# Patient Record
Sex: Female | Born: 1970 | Race: White | Hispanic: No | Smoking: Current some day smoker
Health system: Southern US, Community
[De-identification: ages and names within clinical notes are randomized; demographics above are authoritative.]

## PROBLEM LIST (undated history)

## (undated) HISTORY — PX: AUGMENTATION MAMMAPLASTY: SUR837

## (undated) HISTORY — PX: BREAST SURGERY: SHX581

## (undated) HISTORY — PX: FOOT SURGERY: SHX648

---

## 2003-09-14 ENCOUNTER — Other Ambulatory Visit: Admission: RE | Admit: 2003-09-14 | Discharge: 2003-09-14 | Payer: Self-pay | Admitting: Obstetrics & Gynecology

## 2004-01-04 ENCOUNTER — Inpatient Hospital Stay (HOSPITAL_COMMUNITY): Admission: AD | Admit: 2004-01-04 | Discharge: 2004-01-05 | Payer: Self-pay | Admitting: Obstetrics & Gynecology

## 2004-05-30 ENCOUNTER — Inpatient Hospital Stay (HOSPITAL_COMMUNITY): Admission: AD | Admit: 2004-05-30 | Discharge: 2004-06-01 | Payer: Self-pay | Admitting: Obstetrics & Gynecology

## 2004-06-02 ENCOUNTER — Encounter: Admission: RE | Admit: 2004-06-02 | Discharge: 2004-07-02 | Payer: Self-pay | Admitting: Obstetrics & Gynecology

## 2004-09-22 ENCOUNTER — Other Ambulatory Visit: Admission: RE | Admit: 2004-09-22 | Discharge: 2004-09-22 | Payer: Self-pay | Admitting: Obstetrics & Gynecology

## 2006-09-27 ENCOUNTER — Encounter: Admission: RE | Admit: 2006-09-27 | Discharge: 2006-09-27 | Payer: Self-pay | Admitting: Obstetrics & Gynecology

## 2010-08-04 ENCOUNTER — Emergency Department: Payer: Self-pay | Admitting: Emergency Medicine

## 2010-10-26 ENCOUNTER — Other Ambulatory Visit: Payer: Self-pay | Admitting: Obstetrics & Gynecology

## 2011-11-19 ENCOUNTER — Other Ambulatory Visit: Payer: Self-pay | Admitting: Obstetrics & Gynecology

## 2013-01-27 ENCOUNTER — Other Ambulatory Visit: Payer: Self-pay | Admitting: Obstetrics & Gynecology

## 2014-04-20 ENCOUNTER — Other Ambulatory Visit: Payer: Self-pay | Admitting: Obstetrics & Gynecology

## 2014-04-21 LAB — CYTOLOGY - PAP

## 2014-04-27 ENCOUNTER — Other Ambulatory Visit: Payer: Self-pay | Admitting: Obstetrics & Gynecology

## 2015-10-25 ENCOUNTER — Other Ambulatory Visit: Payer: Self-pay | Admitting: Obstetrics & Gynecology

## 2015-10-27 LAB — CYTOLOGY - PAP

## 2016-12-18 ENCOUNTER — Other Ambulatory Visit: Payer: Self-pay | Admitting: Obstetrics & Gynecology

## 2016-12-20 LAB — CYTOLOGY - PAP

## 2018-03-25 ENCOUNTER — Other Ambulatory Visit: Payer: Self-pay

## 2018-03-25 ENCOUNTER — Ambulatory Visit
Admission: EM | Admit: 2018-03-25 | Discharge: 2018-03-25 | Disposition: A | Discharge status: Home / Self Care | Payer: BLUE CROSS/BLUE SHIELD | Attending: Family Medicine | Admitting: Family Medicine

## 2018-03-25 ENCOUNTER — Encounter: Payer: Self-pay | Admitting: Emergency Medicine

## 2018-03-25 DIAGNOSIS — J40 Bronchitis, not specified as acute or chronic: Secondary | ICD-10-CM | POA: Diagnosis not present

## 2018-03-25 MED ORDER — DOXYCYCLINE HYCLATE 100 MG PO CAPS: 100.0000 mg | ORAL_CAPSULE | Freq: Two times a day (BID) | ORAL | 0 refills | Status: DC

## 2018-03-25 MED ORDER — HYDROCOD POLST-CPM POLST ER 10-8 MG/5ML PO SUER: 5.0000 mL | Freq: Two times a day (BID) | ORAL | 0 refills | Status: DC | PRN

## 2018-03-25 NOTE — ED Triage Notes (Signed)
Patient c/o cough and chest congestion since last Friday.   

## 2018-03-25 NOTE — ED Provider Notes (Signed)
[facility]    CSN: [id number] Arrival date & time: 03/25/18  1153  History   Chief Complaint Chief Complaint  Patient presents with  . Cough   HPI  47 year old female presents with cough.  Patient states that she is been sick since Friday.  She is had cough and congestion.  Productive cough.  No fever.  No chills.  She does hear a "rattling" in her chest.  No known exacerbating or relieving factors.  She seems to be worsening and not improving.  No other associated symptoms.  No other complaints.  Social Hx reviewed. Social History Social History   Tobacco Use  . Smoking status: Never Smoker  . Smokeless tobacco: Never Used  Substance Use Topics  . Alcohol use: Not Currently    Frequency: Never  . Drug use: Not on file   Allergies   Patient has no known allergies.   Review of Systems Review of Systems  Constitutional: Negative for fever.  HENT: Positive for congestion.   Respiratory: Positive for cough and chest tightness.     Physical Exam Triage Vital Signs ED Triage Vitals  Enc Vitals Group     BP 03/25/18 1215 118/85     Pulse Rate 03/25/18 1215 83     Resp 03/25/18 1215 16     Temp 03/25/18 1215 98.8 F (37.1 C)     Temp Source 03/25/18 1215 Oral     SpO2 03/25/18 1215 96 %     Weight 03/25/18 1211 195 lb (88.5 kg)     Height 03/25/18 1211 5\' 4"  (1.626 m)     Head Circumference --      Peak Flow --      Pain Score 03/25/18 1211 0     Pain Loc --      Pain Edu? --      Excl. in [place]? --    Updated Vital Signs BP 118/85 (BP Location: Left Arm)   Pulse 83   Temp 98.8 F (37.1 C) (Oral)   Resp 16   Ht 5\' 4"  (1.626 m)   Wt 88.5 kg   SpO2 96%   BMI 33.47 kg/m   Visual Acuity Right Eye Distance:   Left Eye Distance:   Bilateral Distance:    Right Eye Near:   Left Eye Near:    Bilateral Near:     Physical Exam  Constitutional: She is oriented to person, place, and time. She appears well-developed. No distress.  HENT:  Head:  Normocephalic and atraumatic.  Mouth/Throat: Oropharynx is clear and moist.  Cardiovascular: Normal rate and regular rhythm.  Pulmonary/Chest: Effort normal and breath sounds normal. She has no wheezes. She has no rales.  Neurological: She is alert and oriented to person, place, and time.  Psychiatric: She has a normal mood and affect. Her behavior is normal.  Nursing note and vitals reviewed.  UC Treatments / Results  Labs (all labs ordered are listed, but only abnormal results are displayed) Labs Reviewed - No data to display  EKG None  Radiology No results found.  Procedures Procedures (including critical care time)  Medications Ordered in UC Medications - No data to display  Initial Impression / Assessment and Plan / UC Course  I have reviewed the triage vital signs and the nursing notes.  Pertinent labs & imaging results that were available during my care of the patient were reviewed by me and considered in my medical decision making (see chart for details).  47 year old female presents with bronchitis.  Likely viral.  Tussionex as prescribed.  Doxycycline if she fails to improve or worsens.  Final Clinical Impressions(s) / UC Diagnoses   Final diagnoses:  Bronchitis   Discharge Instructions   None    ED Prescriptions    Medication Sig Dispense Auth. Provider   doxycycline (VIBRAMYCIN) 100 MG capsule Take 1 capsule (100 mg total) by mouth 2 (two) times daily. 14 capsule Vernon, [place] G, DO   chlorpheniramine-HYDROcodone (TUSSIONEX PENNKINETIC ER) 10-8 MG/5ML SUER Take 5 mLs by mouth every 12 (twelve) hours as needed. 60 mL Tommie Sams, DO     Controlled Substance Prescriptions [place] Controlled Substance Registry consulted? Not Applicable   Tommie Sams, DO 03/25/18 1323

## 2019-05-15 ENCOUNTER — Emergency Department: Payer: BC Managed Care – PPO

## 2019-05-15 ENCOUNTER — Other Ambulatory Visit: Payer: Self-pay

## 2019-05-15 ENCOUNTER — Emergency Department
Admission: EM | Admit: 2019-05-15 | Discharge: 2019-05-16 | Disposition: A | Discharge status: Home / Self Care | Payer: BC Managed Care – PPO | Attending: Emergency Medicine | Admitting: Emergency Medicine

## 2019-05-15 ENCOUNTER — Other Ambulatory Visit: Payer: Self-pay | Admitting: Emergency Medicine

## 2019-05-15 ENCOUNTER — Encounter: Payer: Self-pay | Admitting: Emergency Medicine

## 2019-05-15 DIAGNOSIS — R079 Chest pain, unspecified: Secondary | ICD-10-CM | POA: Diagnosis present

## 2019-05-15 DIAGNOSIS — R1011 Right upper quadrant pain: Secondary | ICD-10-CM | POA: Insufficient documentation

## 2019-05-15 DIAGNOSIS — R1013 Epigastric pain: Secondary | ICD-10-CM | POA: Insufficient documentation

## 2019-05-15 DIAGNOSIS — F17210 Nicotine dependence, cigarettes, uncomplicated: Secondary | ICD-10-CM | POA: Insufficient documentation

## 2019-05-15 LAB — TROPONIN I (HIGH SENSITIVITY): Troponin I (High Sensitivity): <2 ng/L (ref <18)

## 2019-05-15 LAB — COMPREHENSIVE METABOLIC PANEL
ALT: 95 U/L — ABNORMAL HIGH (ref 0–44)
AST: 143 U/L — ABNORMAL HIGH (ref 15–41)
Albumin: 4.1 g/dL (ref 3.5–5.0)
Alkaline Phosphatase: 101 U/L (ref 38–126)
Anion gap: 8 (ref 5–15)
BUN: 13 mg/dL (ref 6–20)
CO2: 26 mmol/L (ref 22–32)
Calcium: 9.2 mg/dL (ref 8.9–10.3)
Chloride: 104 mmol/L (ref 98–111)
Creatinine, Ser: 0.74 mg/dL (ref 0.44–1.00)
GFR calc Af Amer: >60 mL/min (ref >60)
GFR calc non Af Amer: >60 mL/min (ref >60)
Glucose, Bld: 117 mg/dL — ABNORMAL HIGH (ref 70–99)
Potassium: 3.7 mmol/L (ref 3.5–5.1)
Sodium: 138 mmol/L (ref 135–145)
Total Bilirubin: 1.2 mg/dL (ref 0.3–1.2)
Total Protein: 6.9 g/dL (ref 6.5–8.1)

## 2019-05-15 LAB — LIPASE, BLOOD: Lipase: 21 U/L (ref 11–51)

## 2019-05-15 LAB — CBC
HCT: 39.6 % (ref 36.0–46.0)
Hemoglobin: 13.6 g/dL (ref 12.0–15.0)
MCH: 32.3 pg (ref 26.0–34.0)
MCHC: 34.3 g/dL (ref 30.0–36.0)
MCV: 94.1 fL (ref 80.0–100.0)
Platelets: 261 10*3/uL (ref 150–400)
RBC: 4.21 MIL/uL (ref 3.87–5.11)
RDW: 12.4 % (ref 11.5–15.5)
WBC: 13.3 10*3/uL — ABNORMAL HIGH (ref 4.0–10.5)
nRBC: 0 % (ref 0.0–0.2)

## 2019-05-15 LAB — POCT PREGNANCY, URINE: Preg Test, Ur: NEGATIVE

## 2019-05-15 MED ORDER — MORPHINE SULFATE (PF) 4 MG/ML IV SOLN
4.0000 mg | Freq: Once | INTRAVENOUS | Status: AC
  Administered 2019-05-15: 4 mg via INTRAVENOUS
  Filled 2019-05-15: qty 1

## 2019-05-15 MED ORDER — ONDANSETRON HCL 4 MG/2ML IJ SOLN
4.0000 mg | Freq: Once | INTRAMUSCULAR | Status: AC
  Administered 2019-05-15: 4 mg via INTRAVENOUS
  Filled 2019-05-15: qty 2

## 2019-05-15 MED ORDER — SODIUM CHLORIDE 0.9 % IV BOLUS
1000.0000 mL | Freq: Once | INTRAVENOUS | Status: AC
  Administered 2019-05-15: 1000 mL via INTRAVENOUS

## 2019-05-15 NOTE — ED Provider Notes (Signed)
[facility] Emergency Department Provider Note   ____________________________________________   I have reviewed the triage vital signs and the nursing notes.   HISTORY  Chief Complaint Epigastric pain  History limited by: Not Limited   HPI Beth Tran is a 48 y.o. female who presents to the emergency department today because of concerns for epigastric pain.  She states the pain started about 2 weeks ago.  Is located in the epigastric region.  When it would come on it would last for about an hour.  It does radiate up the center part of her chest. She denies noticing any pattern to factors that brought the pain on or made the pain better.  It did seem to happen more at night.  Saw her doctor about the pain last week and was being treated for gastritis.  She has not noticed any significant improvement since restarting on the medications.  Today at around 1:00 the pain began again.  The pain was severe.  The pain continues to the time my exam. She denies any similar pain in the past.   Records reviewed. Per medical record review patient has a history of visit to PCP roughly 1 week ago and put on treatment for gastritis.   History reviewed. No pertinent past medical history.  There are no active problems to display for this patient.   Past Surgical History:  Procedure Laterality Date  . BREAST SURGERY    . FOOT SURGERY Bilateral     Prior to Admission medications   Medication Sig Start Date End Date Taking? Authorizing Provider  chlorpheniramine-HYDROcodone (TUSSIONEX PENNKINETIC ER) 10-8 MG/5ML SUER Take 5 mLs by mouth every 12 (twelve) hours as needed. 03/25/18   Coral Spikes, DO  doxycycline (VIBRAMYCIN) 100 MG capsule Take 1 capsule (100 mg total) by mouth 2 (two) times daily. 03/25/18   Coral Spikes, DO    Allergies Patient has no known allergies.  No family history on file.  Social History Social History   Tobacco Use  . Smoking status: Current  Every Day Smoker  . Smokeless tobacco: Never Used  Substance Use Topics  . Alcohol use: Yes    Frequency: Never    Comment: occ  . Drug use: Never    Review of Systems Constitutional: No fever/chills Eyes: No visual changes. ENT: No sore throat. Cardiovascular: Denies chest pain. Respiratory: Denies shortness of breath. Gastrointestinal: Positive for epigastric pain.  Genitourinary: Negative for dysuria. Musculoskeletal: Negative for back pain. Skin: Negative for rash. Neurological: Negative for headaches, focal weakness or numbness.  ____________________________________________   PHYSICAL EXAM:  VITAL SIGNS: ED Triage Vitals  Enc Vitals Group     BP 05/15/19 1954 140/87     Pulse Rate 05/15/19 1954 (!) 56     Resp 05/15/19 1954 18     Temp 05/15/19 1954 97.7 F (36.5 C)     Temp Source 05/15/19 1954 Oral     SpO2 05/15/19 1954 99 %     Weight 05/15/19 1951 190 lb (86.2 kg)     Height 05/15/19 1951 5\' 3"  (1.6 m)     Head Circumference --      Peak Flow --      Pain Score 05/15/19 1951 8   Constitutional: Alert and oriented.  Eyes: Conjunctivae are normal.  ENT      Head: Normocephalic and atraumatic.      Nose: No congestion/rhinnorhea.      Mouth/Throat: Mucous membranes are moist.  Neck: No stridor. Hematological/Lymphatic/Immunilogical: No cervical lymphadenopathy. Cardiovascular: Normal rate, regular rhythm.  No murmurs, rubs, or gallops.  Respiratory: Normal respiratory effort without tachypnea nor retractions. Breath sounds are clear and equal bilaterally. No wheezes/rales/rhonchi. Gastrointestinal: Soft and tender to palpation in the epigastric and right upper quadrant.  Genitourinary: Deferred Musculoskeletal: Normal range of motion in all extremities. No lower extremity edema. Neurologic:  Normal speech and language. No gross focal neurologic deficits are appreciated.  Skin:  Skin is warm, dry and intact. No rash noted. Psychiatric: Mood and  affect are normal. Speech and behavior are normal. Patient exhibits appropriate insight and judgment.  ____________________________________________    LABS (pertinent positives/negatives)  Lipase 21 Trop <2 CBC wbc 13.3, hgb 13.6, plt 261 CMP wnl except glu 117, ast 143, alt 95  ____________________________________________   EKG  I, Phineas Semen, attending physician, personally viewed and interpreted this EKG  EKG Time: 1956 Rate: 61 Rhythm: normal sinus rhythm Axis: normal Intervals: qtc 432 QRS: low voltage ST changes: no st elevation Impression: abnormal ekg  ____________________________________________    RADIOLOGY  CXR No acute abnormality  RUQ Cholelithiasis. Dilated common bile duct.   ____________________________________________   PROCEDURES  Procedures  ____________________________________________   INITIAL IMPRESSION / ASSESSMENT AND PLAN / ED COURSE  Pertinent labs & imaging results that were available during my care of the patient were reviewed by me and considered in my medical decision making (see chart for details).   Patient presented to the emergency department today because of concerns for intermittent epigastric pain over the past 2 weeks.  Today around 1 pm the pain became the most severe it is been.  On exam patient is tender in the epigastric and right upper quadrant.  Patient's AST and ALT were minimally elevated.  Patient had a mild leukocytosis.  Right upper quadrant ultrasound was performed which showed gallstones without signs of cholecystitis.  There was however some dilation of the common bile duct.  Given concerns for possible obstructive process and given patient's tenderness will send for MRCP. Discussed plan with patient.   ____________________________________________   FINAL CLINICAL IMPRESSION(S) / ED DIAGNOSES  Final diagnoses:  Epigastric pain     Note: This dictation was prepared with Dragon dictation. Any  transcriptional errors that result from this process are unintentional     Phineas Semen, MD 05/15/19 2247

## 2019-05-15 NOTE — ED Notes (Signed)
Patient transported to X-ray 

## 2019-05-15 NOTE — ED Triage Notes (Signed)
Patient with complaint of chest pain that started about 01:00 this afternoon. Patient points to epigastric. Patient states that she has had nausea, vomiting and shortness of breath. Patient states that she has had similar pain in the past and diagnosed with gastritis.

## 2019-05-16 ENCOUNTER — Emergency Department: Payer: BC Managed Care – PPO

## 2019-05-16 ENCOUNTER — Other Ambulatory Visit: Payer: BC Managed Care – PPO

## 2019-05-16 LAB — TROPONIN I (HIGH SENSITIVITY): Troponin I (High Sensitivity): 2 ng/L (ref <18)

## 2019-05-16 MED ORDER — GADOBUTROL 1 MMOL/ML IV SOLN
7.5000 mL | Freq: Once | INTRAVENOUS | Status: AC | PRN
  Administered 2019-05-16: 7.5 mL via INTRAVENOUS
  Filled 2019-05-16: qty 7.5

## 2019-05-16 MED ORDER — FENTANYL CITRATE (PF) 100 MCG/2ML IJ SOLN: 50.0000 ug | Freq: Once | INTRAMUSCULAR | Status: DC

## 2019-05-16 MED ORDER — MORPHINE SULFATE (PF) 4 MG/ML IV SOLN
4.0000 mg | Freq: Once | INTRAVENOUS | Status: AC
  Administered 2019-05-16: 4 mg via INTRAVENOUS
  Filled 2019-05-16: qty 1

## 2019-05-16 MED ORDER — OXYCODONE HCL 5 MG PO TABS: 5.0000 mg | ORAL_TABLET | Freq: Three times a day (TID) | ORAL | 0 refills | Status: AC | PRN

## 2019-05-16 NOTE — ED Notes (Signed)
[facility] radiology called and MRI is now being read

## 2019-05-16 NOTE — Discharge Instructions (Addendum)
Your liver function was slightly elevated but the MRCP did not show any stones within the CBD.  You had mildly dilated biliary tree.  We are getting to give you the number to follow-up with GI as you probably need an endoscopy to evaluate for ulcer. Do not drive or work while on opioid.  Use rarely for break through pain.

## 2019-05-16 NOTE — ED Provider Notes (Signed)
2:10 AM Assumed care for off going team.   Blood pressure 140/87, pulse (!) 56, temperature 97.7 F (36.5 C), temperature source Oral, resp. rate 18, height 5\' 3"  (1.6 m), weight 86.2 kg, SpO2 99 %.  See their HPI for full report but in brief patient came in for epigastric pain.  She had minimal elevation of her AST and ALT which prompted a ultrasound that showed possible common bile duct dilation.  MRCP was done that showed no stones within the CBD and dilated dilated biliary tree.  Discussed with patient and she is already started on a PPI and carafate a few days ago but still having a severe pain we discussed avoiding NSAIDs, alcohol, smoking and following up with GI for endoscopy.  In the meantime we will give a very short course of oxycodone to help with breakthrough pain given patient's symptoms.  At this time patient looks very comfortable has had no vomiting and feels comfortable with being going home and following up outpatient.  Patient also given surgeries number to follow-up for gallbladder removal.  I discussed the provisional nature of ED diagnosis, the treatment so far, the ongoing plan of care, follow up appointments and return precautions with the patient and any family or support people present. They expressed understanding and agreed with the plan, discharged home.           Vanessa Jerusalem, MD 05/16/19 [phone]

## 2019-05-26 ENCOUNTER — Encounter: Payer: Self-pay | Admitting: *Deleted

## 2019-05-26 ENCOUNTER — Emergency Department
Admission: EM | Admit: 2019-05-26 | Discharge: 2019-05-26 | Disposition: A | Discharge status: Home / Self Care | Payer: BC Managed Care – PPO | Attending: Emergency Medicine | Admitting: Emergency Medicine

## 2019-05-26 ENCOUNTER — Other Ambulatory Visit: Payer: Self-pay

## 2019-05-26 ENCOUNTER — Emergency Department: Payer: BC Managed Care – PPO

## 2019-05-26 DIAGNOSIS — K8020 Calculus of gallbladder without cholecystitis without obstruction: Secondary | ICD-10-CM | POA: Diagnosis not present

## 2019-05-26 DIAGNOSIS — F17210 Nicotine dependence, cigarettes, uncomplicated: Secondary | ICD-10-CM | POA: Diagnosis not present

## 2019-05-26 DIAGNOSIS — R1013 Epigastric pain: Secondary | ICD-10-CM | POA: Diagnosis present

## 2019-05-26 DIAGNOSIS — Z3201 Encounter for pregnancy test, result positive: Secondary | ICD-10-CM

## 2019-05-26 DIAGNOSIS — R10819 Abdominal tenderness, unspecified site: Secondary | ICD-10-CM | POA: Diagnosis not present

## 2019-05-26 DIAGNOSIS — R109 Unspecified abdominal pain: Secondary | ICD-10-CM

## 2019-05-26 LAB — URINALYSIS, COMPLETE (UACMP) WITH MICROSCOPIC
Bacteria, UA: NONE SEEN
Bilirubin Urine: NEGATIVE
Glucose, UA: NEGATIVE mg/dL
Hgb urine dipstick: NEGATIVE
Ketones, ur: NEGATIVE mg/dL
Leukocytes,Ua: NEGATIVE
Nitrite: NEGATIVE
Protein, ur: NEGATIVE mg/dL
Specific Gravity, Urine: 1.014 (ref 1.005–1.030)
pH: 6 (ref 5.0–8.0)

## 2019-05-26 LAB — CBC
HCT: 44.3 % (ref 36.0–46.0)
Hemoglobin: 14.8 g/dL (ref 12.0–15.0)
MCH: 32 pg (ref 26.0–34.0)
MCHC: 33.4 g/dL (ref 30.0–36.0)
MCV: 95.7 fL (ref 80.0–100.0)
Platelets: 322 10*3/uL (ref 150–400)
RBC: 4.63 MIL/uL (ref 3.87–5.11)
RDW: 12.5 % (ref 11.5–15.5)
WBC: 13.2 10*3/uL — ABNORMAL HIGH (ref 4.0–10.5)
nRBC: 0 % (ref 0.0–0.2)

## 2019-05-26 LAB — COMPREHENSIVE METABOLIC PANEL
ALT: 119 U/L — ABNORMAL HIGH (ref 0–44)
AST: 169 U/L — ABNORMAL HIGH (ref 15–41)
Albumin: 4.3 g/dL (ref 3.5–5.0)
Alkaline Phosphatase: 132 U/L — ABNORMAL HIGH (ref 38–126)
Anion gap: 11 (ref 5–15)
BUN: 10 mg/dL (ref 6–20)
CO2: 25 mmol/L (ref 22–32)
Calcium: 9.4 mg/dL (ref 8.9–10.3)
Chloride: 104 mmol/L (ref 98–111)
Creatinine, Ser: 0.69 mg/dL (ref 0.44–1.00)
GFR calc Af Amer: >60 mL/min (ref >60)
GFR calc non Af Amer: >60 mL/min (ref >60)
Glucose, Bld: 114 mg/dL — ABNORMAL HIGH (ref 70–99)
Potassium: 3.8 mmol/L (ref 3.5–5.1)
Sodium: 140 mmol/L (ref 135–145)
Total Bilirubin: 1.1 mg/dL (ref 0.3–1.2)
Total Protein: 7.4 g/dL (ref 6.5–8.1)

## 2019-05-26 LAB — LIPASE, BLOOD: Lipase: 54 U/L — ABNORMAL HIGH (ref 11–51)

## 2019-05-26 LAB — HCG, QUANTITATIVE, PREGNANCY: hCG, Beta Chain, Quant, S: 9 m[IU]/mL — ABNORMAL HIGH (ref <5)

## 2019-05-26 MED ORDER — HYDROCODONE-ACETAMINOPHEN 5-325 MG PO TABS: 1.0000 | ORAL_TABLET | ORAL | 0 refills | Status: DC | PRN

## 2019-05-26 MED ORDER — LIDOCAINE VISCOUS HCL 2 % MT SOLN
15.0000 mL | Freq: Once | OROMUCOSAL | Status: AC
  Administered 2019-05-26: 15 mL via ORAL
  Filled 2019-05-26: qty 15

## 2019-05-26 MED ORDER — ALUM & MAG HYDROXIDE-SIMETH 200-200-20 MG/5ML PO SUSP
30.0000 mL | Freq: Once | ORAL | Status: AC
  Administered 2019-05-26: 18:00:00 30 mL via ORAL
  Filled 2019-05-26: qty 30

## 2019-05-26 MED ORDER — ONDANSETRON 4 MG PO TBDP: 4.0000 mg | ORAL_TABLET | Freq: Three times a day (TID) | ORAL | 0 refills | Status: AC | PRN

## 2019-05-26 MED ORDER — ONDANSETRON 4 MG PO TBDP: 4.0000 mg | ORAL_TABLET | Freq: Three times a day (TID) | ORAL | 0 refills | Status: DC | PRN

## 2019-05-26 NOTE — Discharge Instructions (Signed)
Please follow-up with your GI doctor as scheduled.  Please follow-up with your OB/GYN as scheduled please inform them that your beta hCG level was 9 in the emergency department today, and that may require further work-up or monitoring going forward.  Return to the emergency department for any sudden increase in pain, fever, or any other symptom personally concerning to yourself.

## 2019-05-26 NOTE — ED Notes (Signed)
POC urine preg appears faintly positive. RN checked a second time and it appears faintly positive another time. Hcg blood test ordered to confirm.

## 2019-05-26 NOTE — ED Provider Notes (Signed)
[facility] Emergency Department Provider Note  Time seen: 6:29 PM  I have reviewed the triage vital signs and the nursing notes.   HISTORY  Chief Complaint Abdominal Pain   HPI Beth Tran is a 48 y.o. female with no significant past medical history presents to the emergency department for epigastric pain.  According to the patient she was diagnosed with gallstones, has had a work-up including a GI visit.  Had an MRI showing gallstones but no inflammation of the gallbladder.  Patient states the pain has been intermittent over the past 1 month.  She has been taking her PPI and Carafate as prescribed by her GI doctor and has an endoscopy scheduled in approximately 2 weeks.  Patient states her pain worsened today which she describes more of a burning or sharp pain in the upper abdomen.  She came to the emergency department for evaluation.  Denies any fever.    History reviewed. No pertinent past medical history.  There are no active problems to display for this patient.   Past Surgical History:  Procedure Laterality Date  . BREAST SURGERY    . FOOT SURGERY Bilateral     Prior to Admission medications   Medication Sig Start Date End Date Taking? Authorizing Provider  chlorpheniramine-HYDROcodone (TUSSIONEX PENNKINETIC ER) 10-8 MG/5ML SUER Take 5 mLs by mouth every 12 (twelve) hours as needed. 03/25/18   Coral Spikes, DO  doxycycline (VIBRAMYCIN) 100 MG capsule Take 1 capsule (100 mg total) by mouth 2 (two) times daily. 03/25/18   Coral Spikes, DO    No Known Allergies  History reviewed. No pertinent family history.  Social History Social History   Tobacco Use  . Smoking status: Current Every Day Smoker  . Smokeless tobacco: Never Used  Substance Use Topics  . Alcohol use: Yes    Frequency: Never    Comment: occ  . Drug use: Never    Review of Systems Constitutional: Negative for fever. Cardiovascular: Negative for chest pain. Respiratory:  Negative for shortness of breath. Gastrointestinal: Upper abdominal/epigastric pain. Genitourinary: Negative for urinary compaints Musculoskeletal: Negative for musculoskeletal complaints Neurological: Negative for headache All other ROS negative  ____________________________________________   PHYSICAL EXAM:  VITAL SIGNS: ED Triage Vitals  Enc Vitals Group     BP 05/26/19 1556 (!) 131/98     Pulse Rate 05/26/19 1556 62     Resp 05/26/19 1556 18     Temp 05/26/19 1556 98.7 F (37.1 C)     Temp Source 05/26/19 1556 Oral     SpO2 05/26/19 1556 99 %     Weight 05/26/19 1557 183 lb (83 kg)     Height 05/26/19 1557 5\' 3"  (1.6 m)     Head Circumference --      Peak Flow --      Pain Score --      Pain Loc --      Pain Edu? --      Excl. in [place]? --     Constitutional: Alert and oriented. Well appearing and in no distress. Eyes: Normal exam ENT      Head: Normocephalic and atraumatic.      Mouth/Throat: Mucous membranes are moist. Cardiovascular: Normal rate, regular rhythm. No murmur Respiratory: Normal respiratory effort without tachypnea nor retractions. Breath sounds are clear  Gastrointestinal: Soft, moderate epigastric tenderness to palpation without rebound guarding or distention. Musculoskeletal: Nontender with normal range of motion in all extremities Neurologic:  Normal speech and language.  No gross focal neurologic deficits Skin:  Skin is warm, dry and intact.  Psychiatric: Mood and affect are normal.  ____________________________________________    EKG  EKG shows sinus bradycardia 59 bpm with a narrow QRS, normal axis, normal intervals with no concerning ST changes.  ____________________________________________     RADIOLOGY  IMPRESSION:  1. Cholelithiasis without sonographic evidence of acute  cholecystitis.  2. Common bile duct measures 7 mm, which is the upper limits of  normal. This is unchanged compared to prior study.   IMPRESSION:  1. No  intrauterine gestational sac identified. IUD appears  appropriately positioned within the endometrial cavity. Correlation  with serial beta HCG levels and short-term follow-up ultrasound are  recommended.  2. Unremarkable appearance of the right ovary/adnexa.  3. Left ovary was not visualized.  4. No free fluid within the pelvis.   ____________________________________________   INITIAL IMPRESSION / ASSESSMENT AND PLAN / ED COURSE  Pertinent labs & imaging results that were available during my care of the patient were reviewed by me and considered in my medical decision making (see chart for details).   Patient presents to the emergency department for evaluation of upper abdominal pain chest epigastric pain.  Pain has been ongoing but intermittent over the past 1 month.  Has been seen in the ED as well as by GI.  Patient currently taking a PPI and Carafate for possible gastritis has an endoscopy scheduled in 2 weeks.  States her pain worsened today so she came to the emergency department for evaluation.  Differential would include biliary colic, cholecystitis, pancreatitis, gastritis or gastroenteritis.  Patient's labs have resulted showing continued mild LFT elevation, with a mild elevation of her lipase.  Patient also has very slight elevation in beta hCG which was tested after a very faint positive on a urine pregnancy test.  Patient states she is abstinent and there is no chance of pregnancy.  Given this we will proceed with an ultrasound of the pelvis to rule out ovarian pathology while the patient is in the ED.  Patient agreeable plan of care.  We will also obtain right upper quadrant ultrasound to evaluate for cholecystitis.  Patient's ultrasound shows cholelithiasis without cholecystitis.  Pelvic ultrasound shows no acute abnormalities.  We will have the patient follow-up with her doctor regarding her slightly elevated beta hCG.  Patient has a follow-up with GI medicine already  scheduled.  Beth Tran was evaluated in Emergency Department on 05/26/2019 for the symptoms described in the history of present illness. She was evaluated in the context of the global COVID-19 pandemic, which necessitated consideration that the patient might be at risk for infection with the SARS-CoV-2 virus that causes COVID-19. Institutional protocols and algorithms that pertain to the evaluation of patients at risk for COVID-19 are in a state of rapid change based on information released by regulatory bodies including the CDC and federal and state organizations. These policies and algorithms were followed during the patient's care in the ED.  ____________________________________________   FINAL CLINICAL IMPRESSION(S) / ED DIAGNOSES  Upper abdominal pain   Minna Antis, MD 05/26/19 1956

## 2019-05-26 NOTE — ED Triage Notes (Signed)
Pt to ED reporting abd pain with NVD. No fever at this time. Pt reports she was seen in ED 1 week ago and was told she had gallstones but no other reason for the pain.

## 2019-06-09 ENCOUNTER — Other Ambulatory Visit: Payer: Self-pay | Admitting: Gastroenterology

## 2019-06-09 DIAGNOSIS — R1013 Epigastric pain: Secondary | ICD-10-CM

## 2019-06-09 DIAGNOSIS — K8020 Calculus of gallbladder without cholecystitis without obstruction: Secondary | ICD-10-CM

## 2019-06-17 ENCOUNTER — Other Ambulatory Visit: Payer: Self-pay

## 2019-06-17 ENCOUNTER — Encounter
Admission: RE | Admit: 2019-06-17 | Discharge: 2019-06-17 | Disposition: A | Discharge status: Home / Self Care | Payer: BC Managed Care – PPO | Source: Ambulatory Visit | Attending: Gastroenterology | Admitting: Gastroenterology

## 2019-06-17 DIAGNOSIS — R1013 Epigastric pain: Secondary | ICD-10-CM | POA: Diagnosis present

## 2019-06-17 DIAGNOSIS — K8020 Calculus of gallbladder without cholecystitis without obstruction: Secondary | ICD-10-CM

## 2019-06-17 MED ORDER — TECHNETIUM TC 99M MEBROFENIN IV KIT
5.0000 | PACK | Freq: Once | INTRAVENOUS | Status: AC | PRN
  Administered 2019-06-17: 5.219 via INTRAVENOUS

## 2021-02-17 IMAGING — MR MR ABDOMEN WO/W CM MRCP
18 of 23 series · 35 of 48 positions shown · IV contrast (7.5ml Gadavist)
Comparison: Right upper quadrant ultrasound dated 05/15/2019

CLINICAL DATA: Epigastric pain, dilated CBD

EXAM:
MRI ABDOMEN WITHOUT AND WITH CONTRAST (INCLUDING MRCP)
TECHNIQUE: Multiplanar multisequence MR imaging of the abdomen was performed
both before and after the administration of intravenous contrast.
Heavily T2-weighted images of the biliary and pancreatic ducts were
obtained, and three-dimensional MRCP images were rendered by post
processing.
CONTRAST:  7.5mL GADAVIST GADOBUTROL 1 MMOL/ML IV SOLN

[Series 3: bSSFP · coronal · 6.0mm · 0.78mm/px · 2 of 30 slices shown]
[im 1/30]
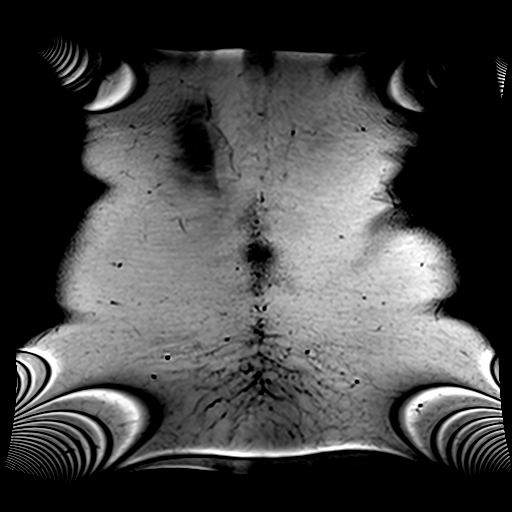
[im 30/30]
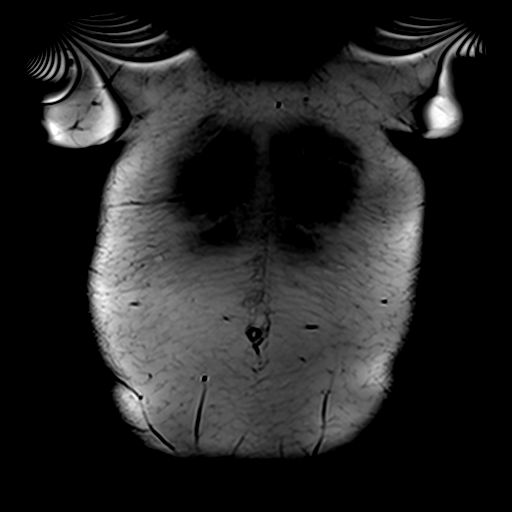

[Series 4: T2 · axial · 6.0mm · 1.25mm/px · z∈[-20,+203]mm · 2 of 32 slices shown]
[im 1/32]
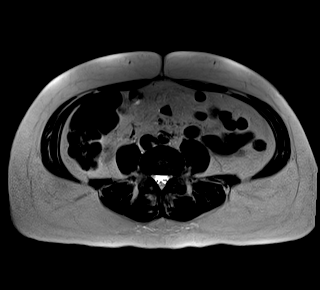
[im 32/32]
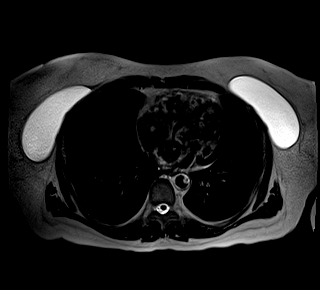

[Series 5: T1 · axial · 6.0mm · 0.74mm/px · z∈[-20,+203]mm · 2 of 32 slices shown (1 of 2)]
[im 1/32]
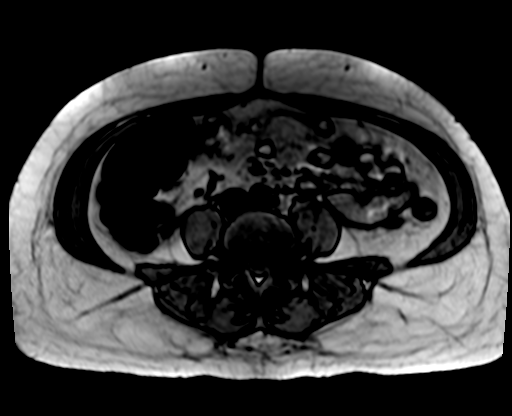
[im 32/32]
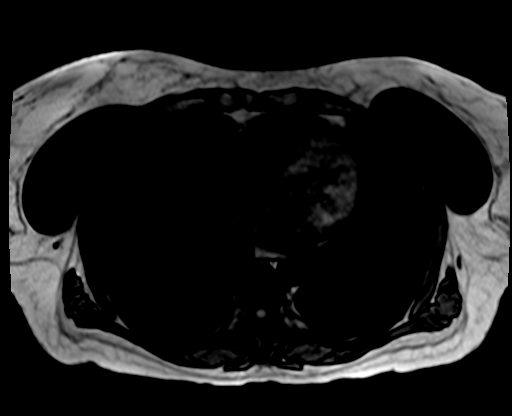

[Series 5: T1 · axial · 6.0mm · 0.74mm/px · 1 of 32 slices shown (2 of 2)]
[im 1/32]
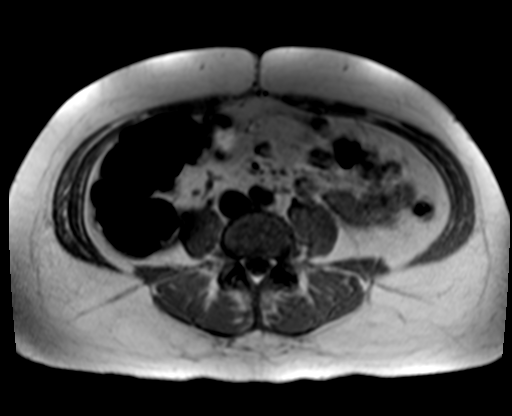

[Series 8: T2 fat-sat · axial · 6.0mm · 1.25mm/px · 1 of 30 slices shown]
[im 1/30]
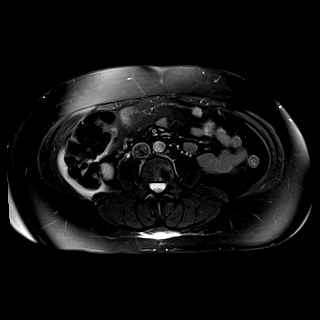

[Series 9: ax dwi_tracew · axial · 6.0mm · 1.42mm/px · 1 of 30 slices shown (1 of 3)]
[im 1/30]
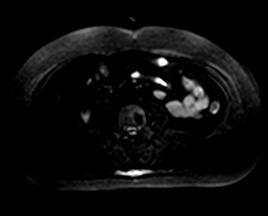

[Series 9: ax dwi_tracew · axial · 6.0mm · 1.42mm/px · 1 of 30 slices shown (2 of 3)]
[im 1/30]
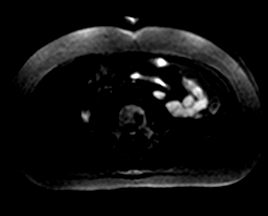

[Series 9: ax dwi_tracew · axial · 6.0mm · 1.42mm/px · 1 of 30 slices shown (3 of 3)]
[im 1/30]
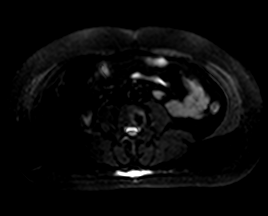

[Series 10: ax dwi_adc · axial · 6.0mm · 1.42mm/px · 1 of 30 slices shown]
[im 1/30]
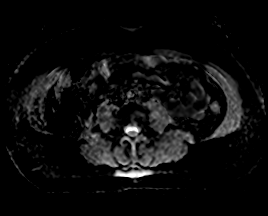

[Series 12: t2_space_cor_cs20_trig_384_iso · coronal · 1.0mm · 0.52mm/px · 3 of 72 slices shown]
[im 1/72]
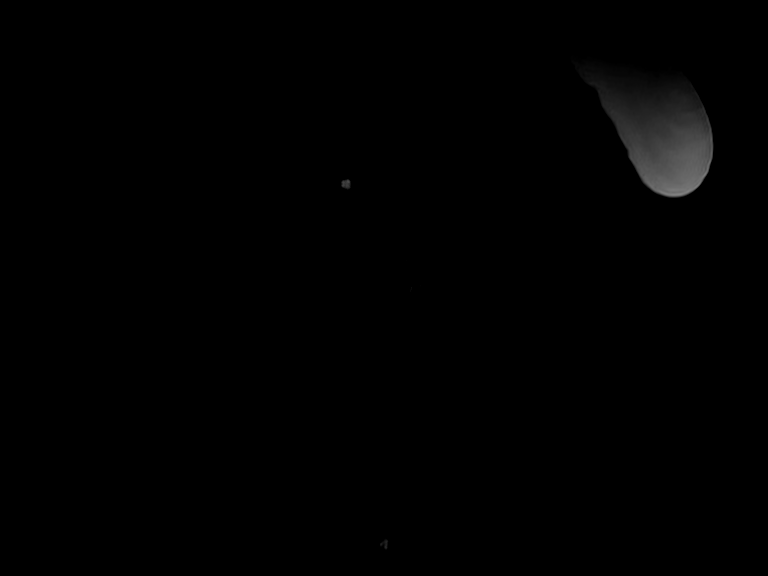
[im 36/72]
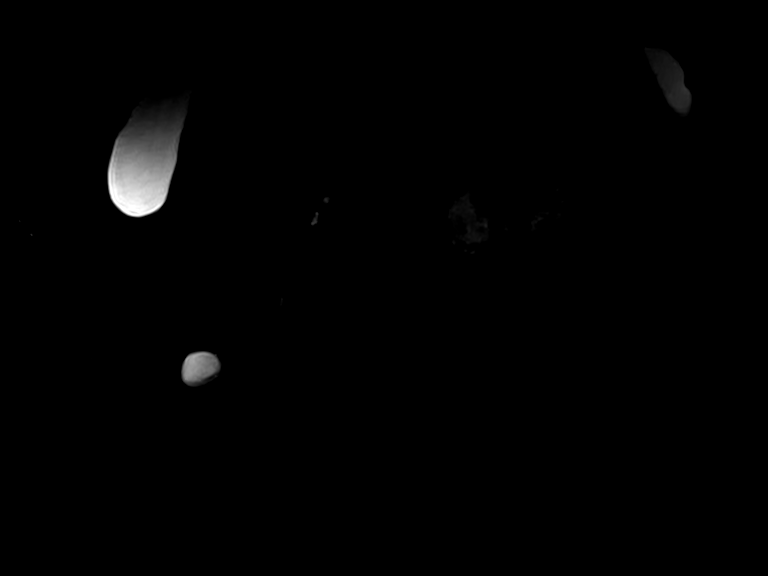
[im 72/72]
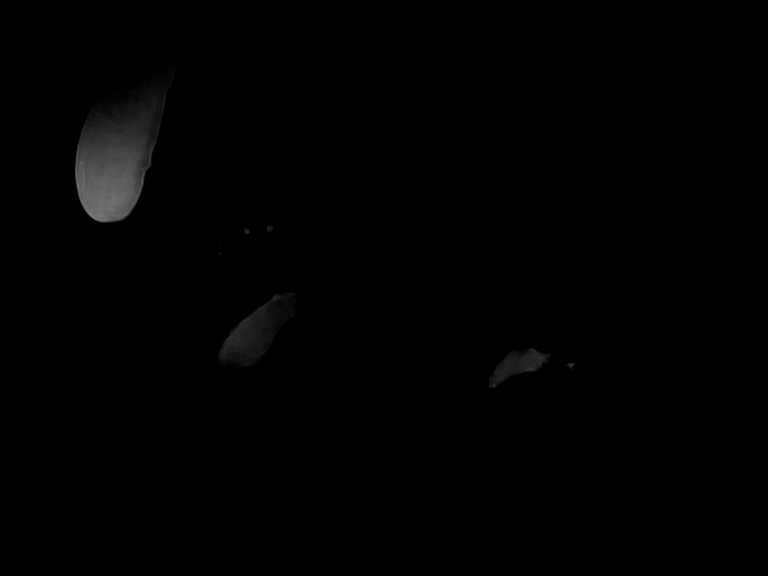

[Series 14: MRCP · coronal · 3.0mm · 1.25mm/px · 1 of 17 slices shown]
[im 1/17]
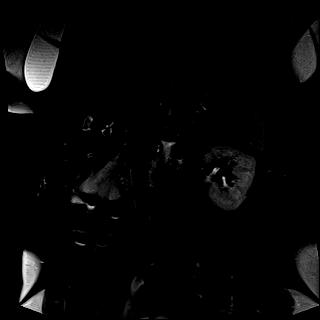

[Series 15: radials · coronal · 50.0mm · 0.78mm/px · 1 of 5 slices shown]
[im 1/5]
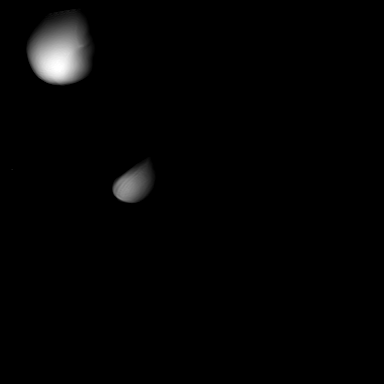

[Series 16: T1 dynamic fat-sat · axial · non-contrast · 3.0mm · 1.25mm/px · z∈[-11,+202]mm · 3 of 72 slices shown (1 of 3)]
[im 1/72]
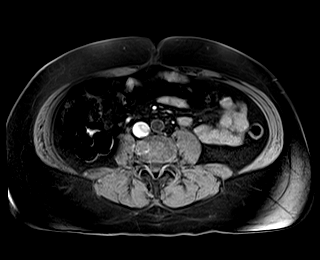
[im 36/72]
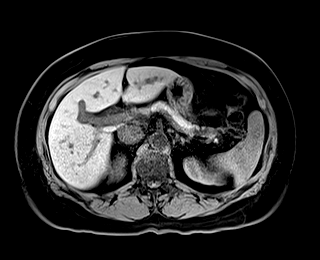
[im 72/72]
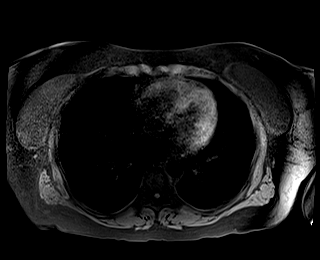

[Series 17: T1 dynamic fat-sat post-contrast · axial · 3.0mm · 1.25mm/px · z∈[-11,+202]mm · 3 of 72 slices shown (1 of 3)]
[im 1/72]
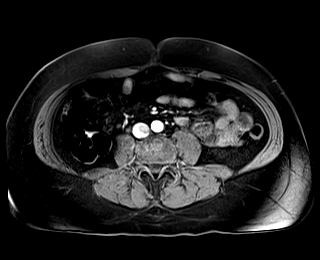
[im 36/72]
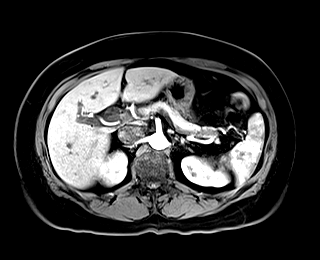
[im 72/72]
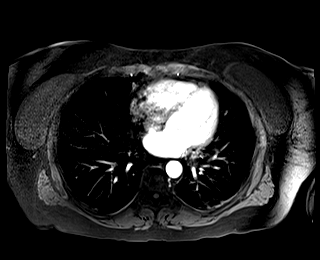

[Series 18: T1 dynamic fat-sat · axial · 3.0mm · 1.25mm/px · z∈[-11,+202]mm · 3 of 72 slices shown (2 of 3)]
[im 1/72]
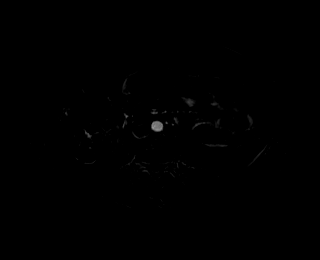
[im 36/72]
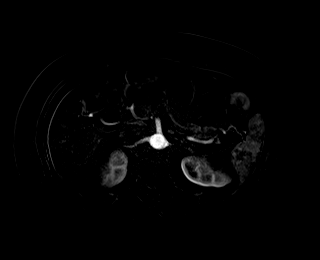
[im 72/72]
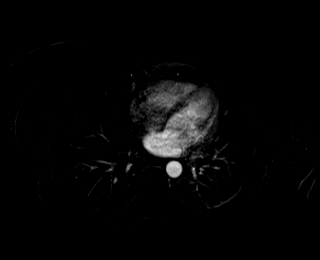

[Series 19: T1 dynamic fat-sat post-contrast · axial · 3.0mm · 1.25mm/px · z∈[-11,+202]mm · 3 of 72 slices shown (2 of 3)]
[im 1/72]
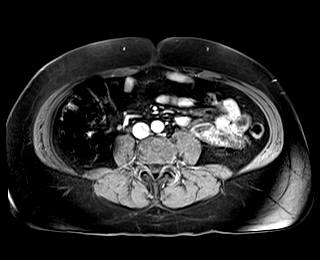
[im 36/72]
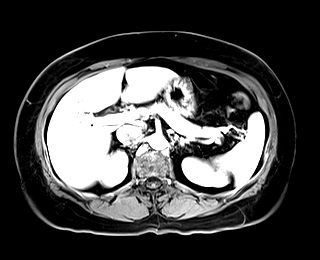
[im 72/72]
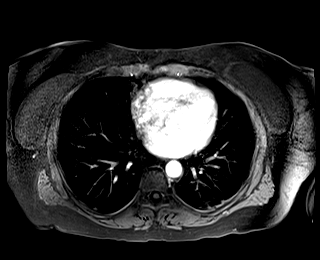

[Series 20: T1 dynamic fat-sat · axial · 3.0mm · 1.25mm/px · z∈[-11,+202]mm · 3 of 72 slices shown (3 of 3)]
[im 1/72]
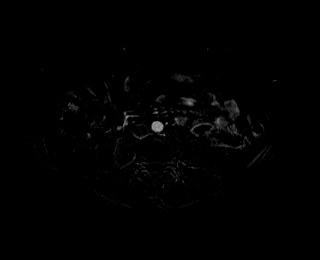
[im 36/72]
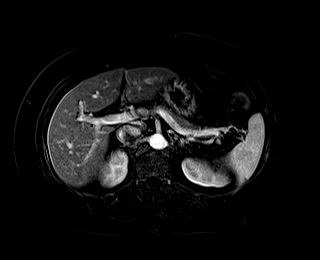
[im 72/72]
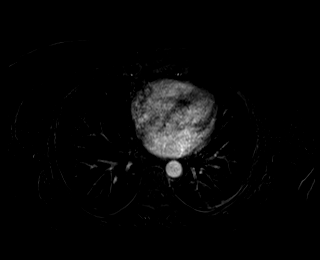

[Series 21: T1 dynamic fat-sat post-contrast · axial · 3.0mm · 1.25mm/px · z∈[-11,+202]mm · 3 of 72 slices shown (3 of 3)]
[im 1/72]
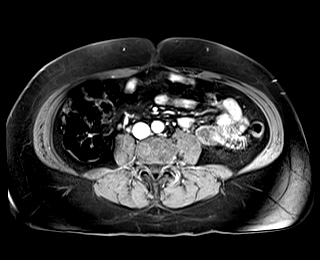
[im 36/72]
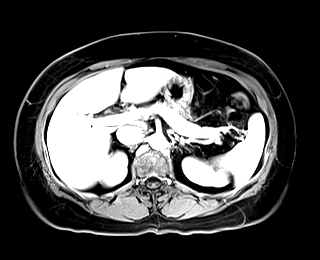
[im 72/72]
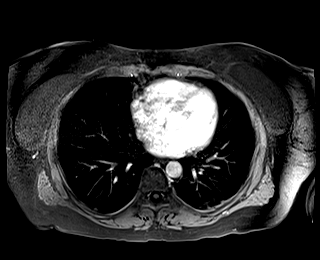

[35 of 48 positions shown; findings below may reference images not displayed]

FINDINGS: Lower chest: Lung bases are clear.

Hepatobiliary: 6 mm cyst in the left hepatic lobe (series 8/image
8). No suspicious/enhancing hepatic lesions.

Tiny layering gallstone (series 4/image 20). Mild gallbladder wall
thickening/edema. However, there are no convincing inflammatory
changes to suggest acute cholecystitis on MR.

No intrahepatic ductal dilatation. Common duct is mildly prominent,
measuring 7 mm, within the upper limits of normal. No
choledocholithiasis is seen.

Pancreas:  Within normal limits.

Spleen:  Within normal limits.

Adrenals/Urinary Tract:  Adrenal glands are within normal limits.

Small left renal cysts, measuring up to 11 mm in the lateral left
lower pole (series 4/image 28), benign (Bosniak I). Right kidney is
within normal limits. No hydronephrosis.

Stomach/Bowel: Stomach is within normal limits.

Visualized bowel is grossly unremarkable.

Vascular/Lymphatic:  No evidence of abdominal aortic aneurysm.

No suspicious abdominal lymphadenopathy.

Other:  No abdominal ascites.

Musculoskeletal: No focal osseous lesions.
IMPRESSION: Cholelithiasis. Mild gallbladder wall thickening/edema, without
convincing inflammatory changes to suggest acute cholecystitis on
MR. This appearance may be secondary to hepatic inflammation.

No intrahepatic or extrahepatic ductal dilatation. Common duct
measures 7 mm, at the upper limits of normal. No choledocholithiasis
is seen.

Additional ancillary findings as above.

## 2021-03-27 ENCOUNTER — Other Ambulatory Visit: Payer: Self-pay | Admitting: Obstetrics & Gynecology

## 2021-03-27 DIAGNOSIS — R928 Other abnormal and inconclusive findings on diagnostic imaging of breast: Secondary | ICD-10-CM

## 2021-04-07 ENCOUNTER — Ambulatory Visit
Admission: RE | Admit: 2021-04-07 | Discharge: 2021-04-07 | Disposition: A | Discharge status: Home / Self Care | Payer: BC Managed Care – PPO | Source: Ambulatory Visit | Attending: Obstetrics & Gynecology | Admitting: Obstetrics & Gynecology

## 2021-04-07 ENCOUNTER — Other Ambulatory Visit: Payer: Self-pay | Admitting: Obstetrics & Gynecology

## 2021-04-07 ENCOUNTER — Ambulatory Visit: Payer: BC Managed Care – PPO

## 2021-04-07 ENCOUNTER — Other Ambulatory Visit: Payer: Self-pay

## 2021-04-07 DIAGNOSIS — R928 Other abnormal and inconclusive findings on diagnostic imaging of breast: Secondary | ICD-10-CM

## 2021-04-15 ENCOUNTER — Emergency Department: Payer: BC Managed Care – PPO

## 2021-04-15 ENCOUNTER — Encounter: Payer: Self-pay | Admitting: Radiology

## 2021-04-15 ENCOUNTER — Emergency Department
Admission: EM | Admit: 2021-04-15 | Discharge: 2021-04-15 | Disposition: A | Discharge status: Home / Self Care | Payer: BC Managed Care – PPO | Attending: Emergency Medicine | Admitting: Emergency Medicine

## 2021-04-15 DIAGNOSIS — W1839XA Other fall on same level, initial encounter: Secondary | ICD-10-CM | POA: Insufficient documentation

## 2021-04-15 DIAGNOSIS — S6992XA Unspecified injury of left wrist, hand and finger(s), initial encounter: Secondary | ICD-10-CM | POA: Diagnosis present

## 2021-04-15 DIAGNOSIS — F17200 Nicotine dependence, unspecified, uncomplicated: Secondary | ICD-10-CM | POA: Insufficient documentation

## 2021-04-15 DIAGNOSIS — S52502A Unspecified fracture of the lower end of left radius, initial encounter for closed fracture: Secondary | ICD-10-CM | POA: Insufficient documentation

## 2021-04-15 DIAGNOSIS — S52602A Unspecified fracture of lower end of left ulna, initial encounter for closed fracture: Secondary | ICD-10-CM | POA: Diagnosis not present

## 2021-04-15 DIAGNOSIS — Q7192 Unspecified reduction defect of left upper limb: Secondary | ICD-10-CM

## 2021-04-15 MED ORDER — OXYCODONE HCL 5 MG PO TABS: 5.0000 mg | ORAL_TABLET | Freq: Three times a day (TID) | ORAL | 0 refills | Status: DC | PRN

## 2021-04-15 MED ORDER — OXYCODONE HCL 5 MG PO TABS
5.0000 mg | ORAL_TABLET | Freq: Once | ORAL | Status: AC
  Administered 2021-04-15: 5 mg via ORAL
  Filled 2021-04-15: qty 1

## 2021-04-15 MED ORDER — ACETAMINOPHEN 500 MG PO TABS
1000.0000 mg | ORAL_TABLET | Freq: Once | ORAL | Status: AC
  Administered 2021-04-15: 1000 mg via ORAL
  Filled 2021-04-15: qty 2

## 2021-04-15 MED ORDER — LIDOCAINE HCL (PF) 1 % IJ SOLN
5.0000 mL | Freq: Once | INTRAMUSCULAR | Status: AC
  Administered 2021-04-15: 5 mL
  Filled 2021-04-15: qty 5

## 2021-04-15 MED ORDER — ACETAMINOPHEN 500 MG PO TABS: 1000.0000 mg | ORAL_TABLET | Freq: Three times a day (TID) | ORAL | 0 refills | Status: AC | PRN

## 2021-04-15 NOTE — Discharge Instructions (Signed)
Elevate, apply ice. Keep splint dry and in place until cleared by Orthopedics.Follow up with Orthopedics per recommendation on your follow up discharge instructions. Take pain medication as follows:  Tylenol 1000mg  every 8 hours and 5mg  of oxycodone every 4 hours as needed for breakthrough pain.  If your pain becomes severe, if your fingers become blue/purple or pale, or if you experience pins-and-needles sensation in your fingers and hand, remove the elastic wrap and reapply it looser.  Do not change the hard part of the splint.  If that resolves your symptoms it is okay to stay home and follow-up with orthopedics.  If you continue to have those symptoms please return to the emergency room immediately.

## 2021-04-15 NOTE — ED Notes (Signed)
Please do not discuss medication questions with this patient with visitor in room

## 2021-04-15 NOTE — ED Provider Notes (Signed)
[facility] Emergency Department Provider Note  ____________________________________________  Time seen: Approximately 4:53 AM  I have reviewed the triage vital signs and the nursing notes.   HISTORY  Chief Complaint Wrist Injury   HPI Beth Tran is a 50 y.o. female with no significant past medical history who presents for evaluation of left wrist pain.  Patient reports that she was getting up when she had a cramp in her calf.  She lost her balance and fell.  She was trying to catch her self with her left hand.  She reports that she heard a popping sound from her wrist and has an obvious deformity.  She is complaining of 8 out of 10 constant sharp throbbing pain on the left wrist.  She is right-hand dominant.  She denies shoulder pain, neck pain, back pain.  She denies head trauma or LOC.  History reviewed. No pertinent past medical history.  There are no problems to display for this patient.   Past Surgical History:  Procedure Laterality Date   BREAST SURGERY     FOOT SURGERY Bilateral     Prior to Admission medications   Medication Sig Start Date End Date Taking? Authorizing Provider  acetaminophen (TYLENOL) 500 MG tablet Take 2 tablets (1,000 mg total) by mouth every 8 (eight) hours as needed for mild pain, moderate pain, fever or headache. 04/15/21 04/15/22 Yes Akshith Moncus, [place], MD  oxyCODONE (ROXICODONE) 5 MG immediate release tablet Take 1 tablet (5 mg total) by mouth every 8 (eight) hours as needed. 04/15/21 04/15/22 Yes Timblin Cohick, [place], MD  doxycycline (VIBRAMYCIN) 100 MG capsule Take 1 capsule (100 mg total) by mouth 2 (two) times daily. 03/25/18   Tommie Sams, DO  ondansetron (ZOFRAN ODT) 4 MG disintegrating tablet Take 1 tablet (4 mg total) by mouth every 8 (eight) hours as needed for nausea or vomiting. 05/26/19   Minna Antis, MD    Allergies Patient has no known allergies.  No family history on file.  Social History Social  History   Tobacco Use   Smoking status: Every Day   Smokeless tobacco: Never  Substance Use Topics   Alcohol use: Yes    Comment: occ   Drug use: Never    Review of Systems  Constitutional: Negative for fever. Eyes: Negative for visual changes. ENT: Negative for facial injury or neck injury Cardiovascular: Negative for chest injury. Respiratory: Negative for shortness of breath. Negative for chest wall injury. Gastrointestinal: Negative for abdominal pain or injury. Genitourinary: Negative for dysuria. Musculoskeletal: Negative for back injury, + L wrist pain Skin: Negative for laceration/abrasions. Neurological: Negative for head injury.   ____________________________________________   PHYSICAL EXAM:  VITAL SIGNS: ED Triage Vitals  Enc Vitals Group     BP 04/15/21 0319 (!) 146/98     Pulse Rate 04/15/21 0319 81     Resp 04/15/21 0319 16     Temp 04/15/21 0319 99.4 F (37.4 C)     Temp Source 04/15/21 0319 Oral     SpO2 04/15/21 0319 98 %     Weight 04/15/21 0321 182 lb 15.7 oz (83 kg)     Height 04/15/21 0321 5\' 3"  (1.6 m)     Head Circumference --      Peak Flow --      Pain Score 04/15/21 0321 8     Pain Loc --      Pain Edu? --      Excl. in GC? --  Full spinal precautions maintained throughout the trauma exam. Constitutional: Alert and oriented. No acute distress. Does not appear intoxicated. HEENT Head: Normocephalic and atraumatic. Face: No facial bony tenderness. Stable midface Ears: No hemotympanum bilaterally. No Battle sign Eyes: No eye injury. PERRL. No raccoon eyes Nose: Nontender. No epistaxis. No rhinorrhea Mouth/Throat: Mucous membranes are moist. No oropharyngeal blood. No dental injury. Airway patent without stridor. Normal voice. Neck: no C-collar. No midline c-spine tenderness.  Cardiovascular: Normal rate, regular rhythm. Normal and symmetric distal pulses are present in all extremities. Pulmonary/Chest: Chest wall is stable and  nontender to palpation/compression. Normal respiratory effort. Breath sounds are normal. No crepitus.  Abdominal: Soft, nontender, non distended. Musculoskeletal: Obvious dorsal deformity of the L distal radius, skin intact, Nontender with normal full range of motion in all other extremities. No deformities. No thoracic or lumbar midline spinal tenderness. Pelvis is stable. Skin: Skin is warm, dry and intact. No abrasions or contutions. Psychiatric: Speech and behavior are appropriate. Neurological: Normal speech and language. Moves all extremities to command. No gross focal neurologic deficits are appreciated.  Glascow Coma Score: 4 - Opens eyes on own 6 - Follows simple motor commands 5 - Alert and oriented GCS: 15   ____________________________________________   LABS (all labs ordered are listed, but only abnormal results are displayed)  Labs Reviewed - No data to display ____________________________________________  EKG  none  ____________________________________________  RADIOLOGY  I have personally reviewed the images performed during this visit and I agree with the Radiologist's read.   Interpretation by Radiologist:  DG Wrist 2 Views Left  Result Date: 04/15/2021 CLINICAL DATA:  50 year old female distal radius and ulnar styloid fractures status post reduction. EXAM: LEFT WRIST - 2 VIEW COMPARISON:  0334 hours. FINDINGS: Two views at 0629 hours. Cast/splint material now in place. Slightly less dorsal angulation at the impacted and comminuted distal left radius fracture. Stable mildly comminuted and dorsally displaced ulnar styloid fracture. No new osseous abnormality identified. IMPRESSION: Stable to slightly improved distal left radius fracture with impaction and comminution. Stable ulnar styloid fracture. Electronically Signed   By: Odessa Fleming M.D.   On: 04/15/2021 07:08   DG Wrist Complete Left  Result Date: 04/15/2021 CLINICAL DATA:  Pain in wrist deformity after a  fall. EXAM: LEFT WRIST - COMPLETE 3+ VIEW COMPARISON:  None. FINDINGS: Transverse comminuted fracture of the distal left radial metaphysis with fracture lines extending to the radiocarpal and radioulnar joints. There is an associated nondisplaced ulnar styloid process fracture. Dorsal angulation of the distal radial fracture fragments. Overlying soft tissue swelling. IMPRESSION: Comminuted fractures of the distal left radial metaphysis with dorsal angulation. Nondisplaced fracture of the ulnar styloid process. Electronically Signed   By: Burman Nieves M.D.   On: 04/15/2021 03:54     ____________________________________________   PROCEDURES  Procedure(s) performed:yes .Ortho Injury Treatment  Date/Time: 04/15/2021 6:17 AM Performed by: Nita Sickle, MD Authorized by: Nita Sickle, MD   Consent:    Consent obtained:  Verbal   Consent given by:  Patient   Risks discussed:  Fracture, irreducible dislocation, recurrent dislocation, stiffness, restricted joint movement, vascular damage and nerve damage   Alternatives discussed:  Immobilization and referralInjury location: wrist Location details: left wrist Injury type: fracture-dislocation Pre-procedure neurovascular assessment: neurovascularly intact Pre-procedure distal perfusion: normal Pre-procedure neurological function: normal Pre-procedure range of motion: reduced Anesthesia: hematoma block  Anesthesia: Local anesthesia used: yes Local Anesthetic: lidocaine 1% without epinephrine Anesthetic total: 5 mL  Patient sedated: NoManipulation performed: yes Skin traction  used: yes Skeletal traction used: yes Reduction successful: yes X-ray confirmed reduction: yes Immobilization: splint and sling Splint type: sugar tong Splint Applied by: ED Tech Supplies used: cotton padding, elastic bandage and Ortho-Glass Post-procedure neurovascular assessment: post-procedure neurovascularly intact Post-procedure distal perfusion:  normal Post-procedure neurological function: normal Post-procedure range of motion: unchanged   Critical Care performed:  None ____________________________________________   INITIAL IMPRESSION / ASSESSMENT AND PLAN / ED COURSE  50 y.o. female with no significant past medical history who presents for evaluation of left wrist pain status post mechanical fall.  Patient with obvious dorsal angulation of the distal radius, skin is intact, neurovascular exam is intact.  X-ray visualized by me showing comminuted fractures of the left distal radial metaphysis with dorsal angulation and the ulnar styloid.  Attempt on reduction was done per procedure note above.  Hematoma block was done. patient will be placed on splint.  Discussed splint and wound care follow-up with orthopedics.      ____________________________________________  Please note:  Patient was evaluated in Emergency Department today for the symptoms described in the history of present illness. Patient was evaluated in the context of the global COVID-19 pandemic, which necessitated consideration that the patient might be at risk for infection with the SARS-CoV-2 virus that causes COVID-19. Institutional protocols and algorithms that pertain to the evaluation of patients at risk for COVID-19 are in a state of rapid change based on information released by regulatory bodies including the CDC and federal and state organizations. These policies and algorithms were followed during the patient's care in the ED.  Some ED evaluations and interventions may be delayed as a result of limited staffing during the pandemic.   ____________________________________________   FINAL CLINICAL IMPRESSION(S) / ED DIAGNOSES   Final diagnoses:  Closed fracture of distal ends of left radius and ulna, initial encounter      NEW MEDICATIONS STARTED DURING THIS VISIT:  ED Discharge Orders          Ordered    acetaminophen (TYLENOL) 500 MG tablet  Every 8  hours PRN        04/15/21 0602    oxyCODONE (ROXICODONE) 5 MG immediate release tablet  Every 8 hours PRN        04/15/21 0602             Note:  This document was prepared using Dragon voice recognition software and may include unintentional dictation errors.    Nita Sickle, MD 04/15/21 [phone]

## 2021-04-15 NOTE — ED Triage Notes (Signed)
Patient BIB POV for left wrist deformity after falling backwards and trying to catch self. PMS distal to break still intact. Patient states the incident occurred approximately 35 min ago. Patient rates pain as 8/10 at this moment. Patient is AxOx4.

## 2021-04-21 ENCOUNTER — Other Ambulatory Visit: Payer: Self-pay | Admitting: Specialist

## 2021-04-21 NOTE — H&P (Addendum)
PREOPERATIVE H&P  Chief Complaint: Displaced Left Wrist Fracture  HPI: Beth Tran is a 50 y.o. female who presents for preoperative history and physical with a diagnosis of displaced LeftWrist Fracture.  Closed reductions have been attempted but have not provided adequate reduction.  There is still some deformity which displeases the patient.  After thorough discussion with we have elected to proceed with open reduction internal fixation of the fracture.  Symptoms are rated as moderate to severe, and have been worsening.  This is significantly impairing activities of daily living.  She has elected for surgical management.   No past medical history on file. Past Surgical History:  Procedure Laterality Date   BREAST SURGERY     FOOT SURGERY Bilateral    Social History   Socioeconomic History   Marital status: Divorced    Spouse name: Not on file   Number of children: Not on file   Years of education: Not on file   Highest education level: Not on file  Occupational History   Not on file  Tobacco Use   Smoking status: Every Day   Smokeless tobacco: Never  Substance and Sexual Activity   Alcohol use: Yes    Comment: occ   Drug use: Never   Sexual activity: Not on file  Other Topics Concern   Not on file  Social History Narrative   Not on file   Social Determinants of Health   Financial Resource Strain: Not on file  Food Insecurity: Not on file  Transportation Needs: Not on file  Physical Activity: Not on file  Stress: Not on file  Social Connections: Not on file   No family history on file. No Known Allergies Prior to Admission medications   Medication Sig Start Date End Date Taking? Authorizing Provider  acetaminophen (TYLENOL) 500 MG tablet Take 2 tablets (1,000 mg total) by mouth every 8 (eight) hours as needed for mild pain, moderate pain, fever or headache. 04/15/21 04/15/22  Nita Sickle, MD  doxycycline (VIBRAMYCIN) 100 MG capsule Take 1 capsule (100 mg total)  by mouth 2 (two) times daily. 03/25/18   Tommie Sams, DO  ondansetron (ZOFRAN ODT) 4 MG disintegrating tablet Take 1 tablet (4 mg total) by mouth every 8 (eight) hours as needed for nausea or vomiting. 05/26/19   Minna Antis, MD  oxyCODONE (ROXICODONE) 5 MG immediate release tablet Take 1 tablet (5 mg total) by mouth every 8 (eight) hours as needed. 04/15/21 04/15/22  Nita Sickle, MD     Positive ROS: All other systems have been reviewed and were otherwise negative with the exception of those mentioned in the HPI and as above.  Physical Exam: General: Alert, no acute distress Cardiovascular: No pedal edema. Heart is regular and without murmur.  Respiratory: No cyanosis, no use of accessory musculature. Lungs are clear. GI: No organomegaly, abdomen is soft and non-tender Skin: No lesions in the area of chief complaint Neurologic: Sensation intact distally Psychiatric: Patient is competent for consent with normal mood and affect Lymphatic: No axillary or cervical lymphadenopathy  MUSCULOSKELETAL: Moderate swelling left wrist and hand.  The left wrist is mildly deformed.  Neurovascular status is good.  The skin is intact.  Elbow and shoulder are normal.  Assessment: Displaced left wrist Fracture  Plan: Plan for Procedure(s): OPEN REDUCTION INTERNAL FIXATION (ORIF) DISTAL LEFT RADIUS FRACTURE  The risks benefits and alternatives were discussed with the patient including but not limited to the risks of nonoperative treatment, versus surgical intervention including  infection, bleeding, nerve injury,  blood clots, cardiopulmonary complications, morbidity, mortality, among others, and they were willing to proceed.   Valinda Hoar, MD [phone]   04/21/2021 12:11 PM

## 2021-04-24 NOTE — Progress Notes (Signed)
Patient does not want her med list printed/given with discharge papers.

## 2021-04-25 ENCOUNTER — Ambulatory Visit
Admission: RE | Admit: 2021-04-25 | Discharge: 2021-04-25 | Disposition: A | Discharge status: Home / Self Care | Payer: BC Managed Care – PPO | Attending: Specialist | Admitting: Specialist

## 2021-04-25 ENCOUNTER — Ambulatory Visit: Payer: BC Managed Care – PPO

## 2021-04-25 ENCOUNTER — Encounter: Payer: Self-pay | Admitting: Specialist

## 2021-04-25 ENCOUNTER — Other Ambulatory Visit: Payer: Self-pay

## 2021-04-25 ENCOUNTER — Encounter
Admission: RE | Disposition: A | Discharge status: Home / Self Care | Payer: Self-pay | Source: Home / Self Care | Attending: Specialist

## 2021-04-25 ENCOUNTER — Ambulatory Visit: Payer: BC Managed Care – PPO | Admitting: Certified Registered"

## 2021-04-25 DIAGNOSIS — Z79899 Other long term (current) drug therapy: Secondary | ICD-10-CM | POA: Diagnosis not present

## 2021-04-25 DIAGNOSIS — S52592A Other fractures of lower end of left radius, initial encounter for closed fracture: Secondary | ICD-10-CM | POA: Diagnosis not present

## 2021-04-25 DIAGNOSIS — Z419 Encounter for procedure for purposes other than remedying health state, unspecified: Secondary | ICD-10-CM

## 2021-04-25 DIAGNOSIS — F17200 Nicotine dependence, unspecified, uncomplicated: Secondary | ICD-10-CM | POA: Diagnosis not present

## 2021-04-25 DIAGNOSIS — X58XXXA Exposure to other specified factors, initial encounter: Secondary | ICD-10-CM | POA: Insufficient documentation

## 2021-04-25 HISTORY — PX: ORIF WRIST FRACTURE: SHX2133

## 2021-04-25 LAB — POCT PREGNANCY, URINE: Preg Test, Ur: NEGATIVE

## 2021-04-25 SURGERY — OPEN REDUCTION INTERNAL FIXATION (ORIF) WRIST FRACTURE
Anesthesia: Regional | Site: Wrist | Laterality: Left

## 2021-04-25 MED ORDER — GLYCOPYRROLATE 0.2 MG/ML IJ SOLN
INTRAMUSCULAR | Status: DC | PRN
  Administered 2021-04-25: .2 mg via INTRAVENOUS

## 2021-04-25 MED ORDER — MIDAZOLAM HCL 2 MG/2ML IJ SOLN
INTRAMUSCULAR | Status: DC | PRN
  Administered 2021-04-25: 2 mg via INTRAVENOUS

## 2021-04-25 MED ORDER — FENTANYL CITRATE PF 50 MCG/ML IJ SOSY
PREFILLED_SYRINGE | INTRAMUSCULAR | Status: AC
  Administered 2021-04-25: 50 ug via INTRAVENOUS
  Filled 2021-04-25: qty 2

## 2021-04-25 MED ORDER — CLINDAMYCIN PHOSPHATE 600 MG/50ML IV SOLN
600.0000 mg | INTRAVENOUS | Status: AC
  Administered 2021-04-25: 600 mg via INTRAVENOUS

## 2021-04-25 MED ORDER — CEFAZOLIN SODIUM-DEXTROSE 2-4 GM/100ML-% IV SOLN
INTRAVENOUS | Status: AC
  Filled 2021-04-25: qty 100

## 2021-04-25 MED ORDER — ACETAMINOPHEN 10 MG/ML IV SOLN
INTRAVENOUS | Status: DC | PRN
  Administered 2021-04-25: 1000 mg via INTRAVENOUS

## 2021-04-25 MED ORDER — PHENYLEPHRINE HCL-NACL 20-0.9 MG/250ML-% IV SOLN
INTRAVENOUS | Status: DC | PRN
  Administered 2021-04-25: 25 ug/min via INTRAVENOUS

## 2021-04-25 MED ORDER — 0.9 % SODIUM CHLORIDE (POUR BTL) OPTIME
TOPICAL | Status: DC | PRN
  Administered 2021-04-25: 500 mL

## 2021-04-25 MED ORDER — LACTATED RINGERS IV SOLN: INTRAVENOUS | Status: DC

## 2021-04-25 MED ORDER — DEXAMETHASONE SODIUM PHOSPHATE 10 MG/ML IJ SOLN
INTRAMUSCULAR | Status: AC
  Filled 2021-04-25: qty 1

## 2021-04-25 MED ORDER — ONDANSETRON HCL 4 MG/2ML IJ SOLN
INTRAMUSCULAR | Status: DC | PRN
  Administered 2021-04-25: 4 mg via INTRAVENOUS

## 2021-04-25 MED ORDER — BUPIVACAINE HCL (PF) 0.25 % IJ SOLN
INTRAMUSCULAR | Status: DC | PRN
  Administered 2021-04-25: 18 mL

## 2021-04-25 MED ORDER — NEOMYCIN-POLYMYXIN B GU 40-200000 IR SOLN
Status: AC
  Filled 2021-04-25: qty 2

## 2021-04-25 MED ORDER — GABAPENTIN 300 MG PO CAPS: 300.0000 mg | ORAL_CAPSULE | ORAL | Status: AC

## 2021-04-25 MED ORDER — PROPOFOL 10 MG/ML IV BOLUS
INTRAVENOUS | Status: DC | PRN
  Administered 2021-04-25: 150 mg via INTRAVENOUS

## 2021-04-25 MED ORDER — ACETAMINOPHEN 10 MG/ML IV SOLN
INTRAVENOUS | Status: AC
  Filled 2021-04-25: qty 100

## 2021-04-25 MED ORDER — ROPIVACAINE HCL 5 MG/ML IJ SOLN
INTRAMUSCULAR | Status: DC | PRN
  Administered 2021-04-25: 30 mL via EPIDURAL

## 2021-04-25 MED ORDER — ONDANSETRON HCL 4 MG/2ML IJ SOLN: 4.0000 mg | Freq: Once | INTRAMUSCULAR | Status: DC | PRN

## 2021-04-25 MED ORDER — CEFAZOLIN SODIUM-DEXTROSE 2-4 GM/100ML-% IV SOLN
2.0000 g | INTRAVENOUS | Status: AC
  Administered 2021-04-25: 2 g via INTRAVENOUS

## 2021-04-25 MED ORDER — MELOXICAM 7.5 MG PO TABS: 15.0000 mg | ORAL_TABLET | ORAL | Status: AC

## 2021-04-25 MED ORDER — MELOXICAM 7.5 MG PO TABS
ORAL_TABLET | ORAL | Status: AC
  Administered 2021-04-25: 15 mg via ORAL
  Filled 2021-04-25: qty 2

## 2021-04-25 MED ORDER — CHLORHEXIDINE GLUCONATE CLOTH 2 % EX PADS
6.0000 | MEDICATED_PAD | Freq: Once | CUTANEOUS | Status: AC
  Administered 2021-04-25: 6 via TOPICAL

## 2021-04-25 MED ORDER — MEPERIDINE HCL 25 MG/ML IJ SOLN: 6.2500 mg | INTRAMUSCULAR | Status: DC | PRN

## 2021-04-25 MED ORDER — PHENYLEPHRINE HCL (PRESSORS) 10 MG/ML IV SOLN
INTRAVENOUS | Status: DC | PRN
Start: 2021-04-25 — End: 2021-04-25
  Administered 2021-04-25: 100 ug via INTRAVENOUS
  Administered 2021-04-25 (×3): 80 ug via INTRAVENOUS
  Administered 2021-04-25: 100 ug via INTRAVENOUS
  Administered 2021-04-25: 80 ug via INTRAVENOUS

## 2021-04-25 MED ORDER — DEXAMETHASONE SODIUM PHOSPHATE 10 MG/ML IJ SOLN
INTRAMUSCULAR | Status: DC | PRN
  Administered 2021-04-25: 10 mg via INTRAVENOUS

## 2021-04-25 MED ORDER — CHLORHEXIDINE GLUCONATE 0.12 % MT SOLN
OROMUCOSAL | Status: AC
  Administered 2021-04-25: 15 mL via OROMUCOSAL
  Filled 2021-04-25: qty 15

## 2021-04-25 MED ORDER — CHLORHEXIDINE GLUCONATE 0.12 % MT SOLN: 15.0000 mL | Freq: Once | OROMUCOSAL | Status: AC

## 2021-04-25 MED ORDER — NEOMYCIN-POLYMYXIN B GU 40-200000 IR SOLN
Status: DC | PRN
  Administered 2021-04-25: 2 mL

## 2021-04-25 MED ORDER — PROPOFOL 10 MG/ML IV BOLUS
INTRAVENOUS | Status: AC
  Filled 2021-04-25: qty 20

## 2021-04-25 MED ORDER — MIDAZOLAM HCL 2 MG/2ML IJ SOLN
INTRAMUSCULAR | Status: AC
  Filled 2021-04-25: qty 2

## 2021-04-25 MED ORDER — FENTANYL CITRATE PF 50 MCG/ML IJ SOSY
50.0000 ug | PREFILLED_SYRINGE | INTRAMUSCULAR | Status: AC | PRN
  Administered 2021-04-25: 50 ug via INTRAVENOUS

## 2021-04-25 MED ORDER — GABAPENTIN 400 MG PO CAPS: 400.0000 mg | ORAL_CAPSULE | Freq: Three times a day (TID) | ORAL | 3 refills | Status: AC

## 2021-04-25 MED ORDER — ROPIVACAINE HCL 5 MG/ML IJ SOLN
INTRAMUSCULAR | Status: AC
  Filled 2021-04-25: qty 30

## 2021-04-25 MED ORDER — MIDAZOLAM HCL 2 MG/2ML IJ SOLN
INTRAMUSCULAR | Status: AC
  Administered 2021-04-25: 1 mg via INTRAVENOUS
  Filled 2021-04-25: qty 2

## 2021-04-25 MED ORDER — LIDOCAINE HCL (CARDIAC) PF 100 MG/5ML IV SOSY
PREFILLED_SYRINGE | INTRAVENOUS | Status: DC | PRN
  Administered 2021-04-25: 20 mg via INTRAVENOUS

## 2021-04-25 MED ORDER — FENTANYL CITRATE (PF) 100 MCG/2ML IJ SOLN: 25.0000 ug | INTRAMUSCULAR | Status: DC | PRN

## 2021-04-25 MED ORDER — ORAL CARE MOUTH RINSE: 15.0000 mL | Freq: Once | OROMUCOSAL | Status: AC

## 2021-04-25 MED ORDER — HYDROCODONE-ACETAMINOPHEN 5-325 MG PO TABS: 1.0000 | ORAL_TABLET | Freq: Four times a day (QID) | ORAL | 0 refills | Status: AC | PRN

## 2021-04-25 MED ORDER — FENTANYL CITRATE (PF) 100 MCG/2ML IJ SOLN
INTRAMUSCULAR | Status: AC
  Filled 2021-04-25: qty 2

## 2021-04-25 MED ORDER — CLINDAMYCIN PHOSPHATE 600 MG/50ML IV SOLN
INTRAVENOUS | Status: AC
  Filled 2021-04-25: qty 50

## 2021-04-25 MED ORDER — GABAPENTIN 300 MG PO CAPS
ORAL_CAPSULE | ORAL | Status: AC
  Administered 2021-04-25: 300 mg via ORAL
  Filled 2021-04-25: qty 1

## 2021-04-25 MED ORDER — BUPIVACAINE HCL (PF) 0.5 % IJ SOLN
INTRAMUSCULAR | Status: AC
  Filled 2021-04-25: qty 30

## 2021-04-25 MED ORDER — MIDAZOLAM HCL 2 MG/2ML IJ SOLN
1.0000 mg | INTRAMUSCULAR | Status: AC | PRN
  Administered 2021-04-25: 1 mg via INTRAVENOUS

## 2021-04-25 MED ORDER — BUPIVACAINE HCL (PF) 0.25 % IJ SOLN
INTRAMUSCULAR | Status: AC
  Filled 2021-04-25: qty 30

## 2021-04-25 SURGICAL SUPPLY — 54 items
APL PRP STRL LF DISP 70% ISPRP (MISCELLANEOUS) ×1
BIT DRILL 2 FAST STEP (BIT) ×1 IMPLANT
BIT DRILL 2.5X4 QC (BIT) ×1 IMPLANT
BLADE SURG MINI STRL (BLADE) ×2 IMPLANT
BNDG COHESIVE 4X5 TAN ST LF (GAUZE/BANDAGES/DRESSINGS) IMPLANT
BNDG ESMARK 4X12 TAN STRL LF (GAUZE/BANDAGES/DRESSINGS) ×2 IMPLANT
CHLORAPREP W/TINT 26 (MISCELLANEOUS) ×2 IMPLANT
CUFF TOURN SGL QUICK 18X4 (TOURNIQUET CUFF) IMPLANT
DRAPE FLUOR MINI C-ARM 54X84 (DRAPES) ×2 IMPLANT
DRSG GAUZE FLUFF 36X18 (GAUZE/BANDAGES/DRESSINGS) ×2 IMPLANT
ELECT REM PT RETURN 9FT ADLT (ELECTROSURGICAL) ×2
ELECTRODE REM PT RTRN 9FT ADLT (ELECTROSURGICAL) ×1 IMPLANT
GAUZE 4X4 16PLY ~~LOC~~+RFID DBL (SPONGE) ×2 IMPLANT
GAUZE SPONGE 4X4 12PLY STRL (GAUZE/BANDAGES/DRESSINGS) ×2 IMPLANT
GAUZE XEROFORM 1X8 LF (GAUZE/BANDAGES/DRESSINGS) ×2 IMPLANT
GLOVE SURG ORTHO LTX SZ8.5 (GLOVE) ×2 IMPLANT
GLOVE SURG UNDER LTX SZ8 (GLOVE) ×2 IMPLANT
GOWN STRL REUS W/ TWL LRG LVL3 (GOWN DISPOSABLE) ×1 IMPLANT
GOWN STRL REUS W/TWL LRG LVL3 (GOWN DISPOSABLE) ×2
GOWN STRL REUS W/TWL LRG LVL4 (GOWN DISPOSABLE) ×2 IMPLANT
K-WIRE 1.6 (WIRE) ×2
K-WIRE FX5X1.6XNS BN SS (WIRE) ×1
KIT TURNOVER KIT A (KITS) ×2 IMPLANT
KWIRE FX5X1.6XNS BN SS (WIRE) IMPLANT
MANIFOLD NEPTUNE II (INSTRUMENTS) ×2 IMPLANT
NDL SAFETY ECLIPSE 18X1.5 (NEEDLE) ×1 IMPLANT
NEEDLE HYPO 18GX1.5 SHARP (NEEDLE) ×2
NS IRRIG 500ML POUR BTL (IV SOLUTION) ×2 IMPLANT
PACK EXTREMITY ARMC (MISCELLANEOUS) ×2 IMPLANT
PADDING CAST 4IN STRL (MISCELLANEOUS) ×2
PADDING CAST BLEND 4X4 STRL (MISCELLANEOUS) ×2 IMPLANT
PEG SUBCHONDRAL SMOOTH 2.0X16 (Peg) ×2 IMPLANT
PEG SUBCHONDRAL SMOOTH 2.0X18 (Peg) ×1 IMPLANT
PEG SUBCHONDRAL SMOOTH 2.0X20 (Peg) ×1 IMPLANT
PEG SUBCHONDRAL SMOOTH 2.0X22 (Peg) ×1 IMPLANT
PEG THREADED 2.5MMX14MM LONG (Peg) ×1 IMPLANT
PEG THREADED 2.5MMX18MM LONG (Peg) ×1 IMPLANT
PLATE STAN 24.4X59.5 LT (Plate) ×1 IMPLANT
SCREW BN 12X3.5XNS CORT TI (Screw) IMPLANT
SCREW CORT 3.5X12 (Screw) ×4 IMPLANT
SCREW CORT 3.5X14 LNG (Screw) ×1 IMPLANT
SLING ARM M TX990204 (SOFTGOODS) ×1 IMPLANT
SPLINT CAST 1 STEP 3X12 (MISCELLANEOUS) ×2 IMPLANT
SPONGE T-LAP 18X18 ~~LOC~~+RFID (SPONGE) ×2 IMPLANT
STAPLER SKIN PROX 35W (STAPLE) ×2 IMPLANT
STOCKINETTE 48X4 2 PLY STRL (GAUZE/BANDAGES/DRESSINGS) ×1 IMPLANT
STOCKINETTE BIAS CUT 4 980044 (GAUZE/BANDAGES/DRESSINGS) ×1 IMPLANT
STOCKINETTE STRL 4IN 9604848 (GAUZE/BANDAGES/DRESSINGS) ×2 IMPLANT
SUT VIC AB 2-0 SH 27 (SUTURE) ×2
SUT VIC AB 2-0 SH 27XBRD (SUTURE) ×1 IMPLANT
SUT VIC AB 3-0 PS2 18 (SUTURE) ×2 IMPLANT
SUT VIC AB 3-0 SH 27 (SUTURE) ×2
SUT VIC AB 3-0 SH 27X BRD (SUTURE) ×1 IMPLANT
WATER STERILE IRR 500ML POUR (IV SOLUTION) ×1 IMPLANT

## 2021-04-25 NOTE — Anesthesia Preprocedure Evaluation (Signed)
Anesthesia Evaluation  Patient identified by MRN, date of birth, ID band Patient awake    Reviewed: Allergy & Precautions, NPO status , Patient's Chart, lab work & pertinent test results  Airway Mallampati: II  TM Distance: >3 FB Neck ROM: Full    Dental no notable dental hx.    Pulmonary neg pulmonary ROS, Current Smoker and Patient abstained from smoking.,    Pulmonary exam normal        Cardiovascular negative cardio ROS Normal cardiovascular exam     Neuro/Psych negative neurological ROS  negative psych ROS   GI/Hepatic negative GI ROS, Neg liver ROS,   Endo/Other  negative endocrine ROS  Renal/GU negative Renal ROS  negative genitourinary   Musculoskeletal negative musculoskeletal ROS (+)   Abdominal   Peds negative pediatric ROS (+)  Hematology negative hematology ROS (+)   Anesthesia Other Findings   Reproductive/Obstetrics negative OB ROS                             Anesthesia Physical Anesthesia Plan  ASA: 2  Anesthesia Plan: Regional   Post-op Pain Management:  Regional for Post-op pain and GA combined w/ Regional for post-op pain   Induction: Intravenous  PONV Risk Score and Plan: 2 and Propofol infusion, Midazolam and Ondansetron  Airway Management Planned: Mask and LMA  Additional Equipment:   Intra-op Plan:   Post-operative Plan:   Informed Consent: I have reviewed the patients History and Physical, chart, labs and discussed the procedure including the risks, benefits and alternatives for the proposed anesthesia with the patient or authorized representative who has indicated his/her understanding and acceptance.       Plan Discussed with: CRNA, Anesthesiologist and Surgeon  Anesthesia Plan Comments:         Anesthesia Quick Evaluation

## 2021-04-25 NOTE — H&P (Signed)
THE PATIENT WAS SEEN PRIOR TO SURGERY TODAY.  HISTORY, ALLERGIES, HOME MEDICATIONS AND OPERATIVE PROCEDURE WERE REVIEWED. RISKS AND BENEFITS OF SURGERY DISCUSSED WITH PATIENT AGAIN.  NO CHANGES FROM INITIAL HISTORY AND PHYSICAL NOTED.    

## 2021-04-25 NOTE — Anesthesia Procedure Notes (Signed)
Procedure Name: LMA Insertion Date/Time: 04/25/2021 9:10 AM Performed by: Jaye Beagle, CRNA Pre-anesthesia Checklist: Patient identified, Emergency Drugs available, Suction available and Patient being monitored Patient Re-evaluated:Patient Re-evaluated prior to induction Oxygen Delivery Method: Circle system utilized Preoxygenation: Pre-oxygenation with 100% oxygen Induction Type: IV induction Ventilation: Mask ventilation without difficulty LMA: LMA inserted LMA Size: 4.0 Number of attempts: 1 Tube secured with: Tape Dental Injury: Teeth and Oropharynx as per pre-operative assessment

## 2021-04-25 NOTE — Transfer of Care (Signed)
Immediate Anesthesia Transfer of Care Note  Patient: Beth Tran  Procedure(s) Performed: OPEN REDUCTION INTERNAL FIXATION (ORIF) WRIST FRACTURE (Left: Wrist)  Patient Location: PACU  Anesthesia Type:General  Level of Consciousness: drowsy  Airway & Oxygen Therapy: Patient Spontanous Breathing and Patient connected to face mask oxygen  Post-op Assessment: Report given to RN  Post vital signs: stable  Last Vitals:  Vitals Value Taken Time  BP 117/82 04/25/21 1119  Temp    Pulse 89 04/25/21 1122  Resp 14 04/25/21 1122  SpO2 99 % 04/25/21 1122  Vitals shown include unvalidated device data.  Last Pain:  Vitals:   04/25/21 0729  TempSrc: Temporal  PainSc: 2          Complications: No notable events documented.

## 2021-04-25 NOTE — Anesthesia Postprocedure Evaluation (Signed)
Anesthesia Post Note  Patient: Beth Tran  Procedure(s) Performed: OPEN REDUCTION INTERNAL FIXATION (ORIF) WRIST FRACTURE (Left: Wrist)  Patient location during evaluation: PACU Anesthesia Type: Regional Level of consciousness: awake and alert, awake and oriented Pain management: pain level controlled Vital Signs Assessment: post-procedure vital signs reviewed and stable Respiratory status: spontaneous breathing, nonlabored ventilation and respiratory function stable Cardiovascular status: blood pressure returned to baseline and stable Postop Assessment: no apparent nausea or vomiting Anesthetic complications: no   No notable events documented.   Last Vitals:  Vitals:   04/25/21 1238 04/25/21 1240  BP: (!) 120/108 (!) 131/92  Pulse: 80   Resp: 16   Temp:    SpO2: 100%     Last Pain:  Vitals:   04/25/21 1206  TempSrc: Temporal  PainSc: 0-No pain                 Manfred Arch

## 2021-04-25 NOTE — Op Note (Signed)
04/25/2021  11:19 AM  PATIENT:  Beth Tran    PRE-OPERATIVE DIAGNOSIS: Displaced comminuted left distal radius fracture  POST-OPERATIVE DIAGNOSIS:  Same  PROCEDURE:  OPEN REDUCTION INTERNAL FIXATION (ORIF) WRIST FRACTURE  SURGEON:  Valinda Hoar, MD  TOURNIQUET TIME: 23 MIN  ANESTHESIA:   General  PREOPERATIVE INDICATIONS:  [name] is a  50 y.o. female with a diagnosis of left wrist fracture who failed conservative measures and elected for surgical management.    The risks benefits and alternatives were discussed with the patient preoperatively including but not limited to the risks of infection, bleeding, nerve injury, malunion, nonunion, wrist stiffness, persistent wrist pain, osteoarthritis and the need for further surgery. Medical risks include but are not limited to DVT and pulmonary embolism, myocardial infarction, stroke, pneumonia, respiratory failure and death. Patient  understood these risks and wished to proceed.   OPERATIVE IMPLANTS: [employer] hand innovations plate, 3 hole  OPERATIVE FINDINGS: Comminuted left distal radius fracture which was still reducible.  OPERATIVE PROCEDURE: Patient was seen in the preoperative area. I marked the operative hand with the word yes and my initials according the hospital's correct site of surgery protocol. Patient was then brought to the operating roomand was placed supine on the operative table and underwent general anesthesia with an LMA.   The operative arm was prepped and draped in a sterile fashion. A timeout performed to verify the patient's name, date of birth, medical record number, correct site of surgery correct procedure to be performed. The timeout was also used a timeout to verify patient received antibiotics and appropriate instruments, implants and radiographs studies were available in the room. Once all in attendance were in agreement case began.   Patient then had the operative extremity exsanguinated with an Esmarch.  The tourniquet was placed on the upper extremity and inflated 250 mm.  A manual reduction of the fracture was performed. The fracture reduction was confirmed on FluoroScan imaging.  A linear incision was then made over the FCR tendon. The subcutaneous tissue was carefully dissected using Metzenbaum scissor and Adson pickup. Retractors were used to protect the radial artery and median nerve. The pronator quadratus was identified and incised and elevated off the volar surface of the distal radius. A 3  hole [employer] volar plate was then positioned on the under surface of the distal radius. It was held into position with a K wire. The position of the plate was confirmed on AP and lateral images. Once the plate was in good position a cortical screw was placed bicortically in the sliding hole. Attention was then turned to the distal pegs. The proximal row of pegs was placed first. Each individual peg hole was drilled and then measured with a depth gauge. The proximal row had 3 threaded pegs placed. The distal row was then drilled and smooth pegs were placed. The position and length of all screws were confirmed on AP and lateral FluoroScan imaging. Care was taken to avoid penetration of any peg through the articular surface of the distal radius.  Once all distal pegs were placed, the attention was turned back to placement of bicortical shaft screws. Additional screws were placed in the plate to fill the remaining holes. The wound was then copiously irrigated. Final FluoroScan imaging of the construct were taken. The fracture was in anatomic position and the hardware was well-positioned. The wound again was copiously irrigated. The soft tissue was then carefully over the plate. The tissues were infiltrated with 1/2%  marcaine.  The skin was closed with staples. Xeroform and a dry sterile dressing were applied along with a volar splint. I was scrubbed and present for the entire case and all sharp and instrument  counts were correct at the conclusion the case. The patient tolerated this procedure well and was awakened and taken to the recovery room in good condition.   Deeann Saint, MD

## 2021-04-25 NOTE — Discharge Instructions (Signed)

## 2021-04-25 NOTE — Anesthesia Procedure Notes (Signed)
Anesthesia Regional Block: Supraclavicular block   Pre-Anesthetic Checklist: , timeout performed,  Correct Patient, Correct Site, Correct Laterality,  Correct Procedure, Correct Position, site marked,  Risks and benefits discussed,  Surgical consent,  Pre-op evaluation,  At surgeon's request and post-op pain management  Laterality: Upper and Left  Prep: Dura Prep       Needles:  Injection technique: Single-shot  Needle Type: Echogenic Stimulator Needle     Needle Length: 5cm  Needle Gauge: 22   Needle insertion depth: 3 cm   Additional Needles:   Procedures:, nerve stimulator,,, ultrasound used (permanent image in chart),,   Motor weakness within 2 minutes.   Nerve Stimulator or Paresthesia:  Response: L Forearm, 0.6 mA, 2 ms, 3 cm  Additional Responses:   Narrative:  Start time: 04/25/2021 8:27 AM End time: 04/25/2021 8:28 AM Injection made incrementally with aspirations every 5 mL.  Performed by: Personally  Anesthesiologist: Manfred Arch, MD  Additional Notes: Patient consented for risk and benefits of nerve block including but not limited to nerve damage, failed block, bleeding and infection.  Patient voiced understanding.  Functioning IV was confirmed and monitors were applied.  Timeout done (0820) prior to procedure and prior to any sedation being given to the patient.  Patient confirmed procedure site prior to any sedation given to the patient.  A 3mm 22ga Stimuplex needle was used. Sterile prep,hand hygiene and sterile gloves were used.  Minimal sedation (Versed 2 mg, Fent ) used for procedure.  No paresthesia endorsed by patient during the procedure.  Negative aspiration and negative test dose prior to incremental administration of local anesthetic. The patient tolerated the procedure well with no immediate complications.  Injectate:  5ml 0.5% Ropivacaine + Decadron 10mg 

## 2021-10-06 ENCOUNTER — Ambulatory Visit
Admission: RE | Admit: 2021-10-06 | Discharge: 2021-10-06 | Disposition: A | Discharge status: Home / Self Care | Payer: BC Managed Care – PPO | Source: Ambulatory Visit | Attending: Obstetrics & Gynecology | Admitting: Obstetrics & Gynecology

## 2021-10-06 ENCOUNTER — Other Ambulatory Visit: Payer: Self-pay | Admitting: Obstetrics & Gynecology

## 2021-10-06 DIAGNOSIS — R928 Other abnormal and inconclusive findings on diagnostic imaging of breast: Secondary | ICD-10-CM

## 2021-10-06 DIAGNOSIS — N6489 Other specified disorders of breast: Secondary | ICD-10-CM

## 2022-04-11 ENCOUNTER — Ambulatory Visit
Admission: RE | Admit: 2022-04-11 | Discharge: 2022-04-11 | Disposition: A | Discharge status: Home / Self Care | Payer: BC Managed Care – PPO | Source: Ambulatory Visit | Attending: Obstetrics & Gynecology | Admitting: Obstetrics & Gynecology

## 2022-04-11 DIAGNOSIS — N6489 Other specified disorders of breast: Secondary | ICD-10-CM

## 2022-04-11 DIAGNOSIS — R928 Other abnormal and inconclusive findings on diagnostic imaging of breast: Secondary | ICD-10-CM

## 2023-03-01 ENCOUNTER — Emergency Department
Admission: EM | Admit: 2023-03-01 | Discharge: 2023-03-01 | Disposition: A | Discharge status: Home / Self Care | Payer: No Typology Code available for payment source | Attending: Emergency Medicine | Admitting: Emergency Medicine

## 2023-03-01 ENCOUNTER — Emergency Department: Payer: No Typology Code available for payment source

## 2023-03-01 ENCOUNTER — Other Ambulatory Visit: Payer: Self-pay

## 2023-03-01 DIAGNOSIS — R079 Chest pain, unspecified: Secondary | ICD-10-CM

## 2023-03-01 DIAGNOSIS — E876 Hypokalemia: Secondary | ICD-10-CM | POA: Diagnosis not present

## 2023-03-01 DIAGNOSIS — I471 Supraventricular tachycardia, unspecified: Secondary | ICD-10-CM | POA: Diagnosis not present

## 2023-03-01 DIAGNOSIS — R002 Palpitations: Secondary | ICD-10-CM | POA: Diagnosis present

## 2023-03-01 LAB — COMPREHENSIVE METABOLIC PANEL
ALT: 22 U/L (ref 0–44)
AST: 22 U/L (ref 15–41)
Albumin: 4.4 g/dL (ref 3.5–5.0)
Alkaline Phosphatase: 101 U/L (ref 38–126)
Anion gap: 11 (ref 5–15)
BUN: 12 mg/dL (ref 6–20)
CO2: 22 mmol/L (ref 22–32)
Calcium: 8.9 mg/dL (ref 8.9–10.3)
Chloride: 106 mmol/L (ref 98–111)
Creatinine, Ser: 0.88 mg/dL (ref 0.44–1.00)
GFR, Estimated: >60 mL/min (ref >60)
Glucose, Bld: 136 mg/dL — ABNORMAL HIGH (ref 70–99)
Potassium: 3.4 mmol/L — ABNORMAL LOW (ref 3.5–5.1)
Sodium: 139 mmol/L (ref 135–145)
Total Bilirubin: 0.5 mg/dL (ref 0.3–1.2)
Total Protein: 7.6 g/dL (ref 6.5–8.1)

## 2023-03-01 LAB — CBC WITH DIFFERENTIAL/PLATELET
Abs Immature Granulocytes: 0.03 10*3/uL (ref 0.00–0.07)
Basophils Absolute: 0.1 10*3/uL (ref 0.0–0.1)
Basophils Relative: 1 %
Eosinophils Absolute: 0.2 10*3/uL (ref 0.0–0.5)
Eosinophils Relative: 2 %
HCT: 48.9 % — ABNORMAL HIGH (ref 36.0–46.0)
Hemoglobin: 16.2 g/dL — ABNORMAL HIGH (ref 12.0–15.0)
Immature Granulocytes: 0 %
Lymphocytes Relative: 33 %
Lymphs Abs: 3.5 10*3/uL (ref 0.7–4.0)
MCH: 30.2 pg (ref 26.0–34.0)
MCHC: 33.1 g/dL (ref 30.0–36.0)
MCV: 91.1 fL (ref 80.0–100.0)
Monocytes Absolute: 0.5 10*3/uL (ref 0.1–1.0)
Monocytes Relative: 5 %
Neutro Abs: 6.2 10*3/uL (ref 1.7–7.7)
Neutrophils Relative %: 59 %
Platelets: 332 10*3/uL (ref 150–400)
RBC: 5.37 MIL/uL — ABNORMAL HIGH (ref 3.87–5.11)
RDW: 12.7 % (ref 11.5–15.5)
WBC: 10.6 10*3/uL — ABNORMAL HIGH (ref 4.0–10.5)
nRBC: 0 % (ref 0.0–0.2)

## 2023-03-01 LAB — TROPONIN I (HIGH SENSITIVITY)
Troponin I (High Sensitivity): 8 ng/L (ref <18)
Troponin I (High Sensitivity): 22 ng/L — ABNORMAL HIGH (ref <18)

## 2023-03-01 LAB — MAGNESIUM: Magnesium: 2.3 mg/dL (ref 1.7–2.4)

## 2023-03-01 MED ORDER — ADENOSINE 6 MG/2ML IV SOLN
INTRAVENOUS | Status: AC
  Administered 2023-03-01: 6 mg
  Filled 2023-03-01: qty 2

## 2023-03-01 MED ORDER — MAGNESIUM SULFATE 2 GM/50ML IV SOLN
2.0000 g | Freq: Once | INTRAVENOUS | Status: AC
  Administered 2023-03-01: 2 g via INTRAVENOUS
  Filled 2023-03-01: qty 50

## 2023-03-01 MED ORDER — METOPROLOL SUCCINATE ER 25 MG PO TB24: 25.0000 mg | ORAL_TABLET | Freq: Every day | ORAL | 11 refills | Status: DC

## 2023-03-01 MED ORDER — ADENOSINE 12 MG/4ML IV SOLN
INTRAVENOUS | Status: AC
  Filled 2023-03-01: qty 4

## 2023-03-01 NOTE — ED Triage Notes (Signed)
Pt here with cp and tachycardia. Pt states she is having heaviness in her chest, SOB, and weakness.

## 2023-03-01 NOTE — ED Provider Notes (Signed)
Emergency department handoff note  Care of this patient was signed out to me at the end of the previous provider shift.  All pertinent patient information was conveyed and all questions were answered.  Patient pending repeat troponin that has returned at 22 and increased from 8.  I spoke to on-call cardiology who agrees with plan for discharge on low-dose daily metoprolol and follow-up with cardiology  The patient has been reexamined and is ready to be discharged.  All diagnostic results have been reviewed and discussed with the patient/family.  Care plan has been outlined and the patient/family understands all current diagnoses, results, and treatment plans.  There are no new complaints, changes, or physical findings at this time.  All questions have been addressed and answered.  Patient was instructed to, and agrees to follow-up with their primary care physician as well as return to the emergency department if any new or worsening symptoms develop.   Merwyn Katos, MD 03/01/23 [phone]

## 2023-03-01 NOTE — ED Provider Notes (Signed)
[facility] Provider Note    Event Date/Time   First MD Initiated Contact with Patient 03/01/23 1435     (approximate)   History   Chest Pain   HPI Beth Tran is a 52 y.o. female presenting today for tachycardia.  Patient states she was sitting at work today when she started noticing palpitations.  Shortly after that she started having chest pain and intermittent shortness of breath.  She noticed her heart rate being high so she came in to the emergency department.  She denies nausea, sweating, abdominal pain.  No recent illness, fever, sickness.  No prior history of blood clots.  No chronic medical problems.  No prior cardiac issues.     Physical Exam   Triage Vital Signs: ED Triage Vitals  Encounter Vitals Group     BP 03/01/23 1426 (!) 142/107     Systolic BP Percentile --      Diastolic BP Percentile --      Pulse Rate 03/01/23 1425 (!) 168     Resp 03/01/23 1425 18     Temp 03/01/23 1428 98.4 F (36.9 C)     Temp Source 03/01/23 1428 Oral     SpO2 03/01/23 1425 97 %     Weight 03/01/23 1425 182 lb 15.7 oz (83 kg)     Height 03/01/23 1425 5\' 3"  (1.6 m)     Head Circumference --      Peak Flow --      Pain Score 03/01/23 1425 0     Pain Loc --      Pain Education --      Exclude from Growth Chart --     Most recent vital signs: Vitals:   03/01/23 1445 03/01/23 1500  BP: (!) 137/102   Pulse: (!) 118 (!) 116  Resp: 15 19  Temp:    SpO2: 99% 100%   Physical Exam: I have reviewed the vital signs and nursing notes. General: Awake, alert, no acute distress.  Nontoxic appearing. Head:  Atraumatic, normocephalic.   ENT:  EOM intact, PERRL. Oral mucosa is pink and moist with no lesions. Neck: Neck is supple with full range of motion, No meningeal signs. Cardiovascular:  tachycardic, RR, No murmurs. Peripheral pulses palpable and equal bilaterally. Respiratory:  Symmetrical chest wall expansion.  No rhonchi, rales, or wheezes.  Good air  movement throughout.  No use of accessory muscles.   Musculoskeletal:  No cyanosis or edema. Moving extremities with full ROM Abdomen:  Soft, nontender, nondistended. Neuro:  GCS 15, moving all four extremities, interacting appropriately. Speech clear. Psych:  Calm, appropriate.   Skin:  Warm, dry, no rash.    ED Results / Procedures / Treatments   Labs (all labs ordered are listed, but only abnormal results are displayed) Labs Reviewed  CBC WITH DIFFERENTIAL/PLATELET - Abnormal; Notable for the following components:      Result Value   WBC 10.6 (*)    RBC 5.37 (*)    Hemoglobin 16.2 (*)    HCT 48.9 (*)    All other components within normal limits  COMPREHENSIVE METABOLIC PANEL - Abnormal; Notable for the following components:   Potassium 3.4 (*)    Glucose, Bld 136 (*)    All other components within normal limits  MAGNESIUM  TROPONIN I (HIGH SENSITIVITY)     EKG My initial EKG interpretation shows rate in the 160s with appearance of SVT.  Regular with no indication of atrial fibrillation.  No overt signs of atrial flutter.  No acute ST elevation or depression.  Post 6 mg adenosine: Rate of 115, sinus tachycardia.  Visible P waves.  No sawtooth formation indicating atrial flutter.  No evidence of atrial fibrillation.  No acute ST elevations or depressions   RADIOLOGY Independently interpreted chest x-ray with no acute cardiopulmonary pathology   PROCEDURES:  Critical Care performed: Yes, see critical care procedure note(s)  .Critical Care  Performed by: Janith Lima, MD Authorized by: Janith Lima, MD   Critical care provider statement:    Critical care time (minutes):  35   Critical care time was exclusive of:  Separately billable procedures and treating other patients   Critical care was necessary to treat or prevent imminent or life-threatening deterioration of the following conditions: Supraventricular tachycardia.   Critical care was time spent personally  by me on the following activities:  Ordering and performing treatments and interventions, ordering and review of laboratory studies, ordering and review of radiographic studies, pulse oximetry, re-evaluation of patient's condition, interpretation of cardiac output measurements, evaluation of patient's response to treatment and examination of patient    MEDICATIONS ORDERED IN ED: Medications  adenosine (ADENOCARD) 12 MG/4ML injection (0 mg  Hold 03/01/23 1456)  magnesium sulfate IVPB 2 g 50 mL (2 g Intravenous New Bag/Given 03/01/23 1548)  adenosine (ADENOCARD) 6 MG/2ML injection (6 mg  Given 03/01/23 1443)     IMPRESSION / MDM / ASSESSMENT AND PLAN / ED COURSE  I reviewed the triage vital signs and the nursing notes.                              Differential diagnosis includes, but is not limited to, supraventricular tachycardia, ACS, primary cardiac arrhythmia, dehydration, pneumonia, heart failure.  Patient's presentation is most consistent with acute presentation with potential threat to life or bodily function.  Patient is a 52 year old female presenting today in SVT with a heart rate stable around 170.  Chest pressure symptoms on arrival.  Blood pressure was stable.  Patient was given 6 mg of adenosine which brought heart rate down to 110.  It was sinus tachycardia at this time with no evidence of atrial fibrillation or atrial flutter.  Initial troponin of 8.  No evidence of ST elevation or depression.  There was QTc prolongation patient was given 2 g of mag.  Patient be signed out to Dr. Vicente Males pending repeat troponin and further cardiac assessment and response to treatment.  The patient is on the cardiac monitor to evaluate for evidence of arrhythmia and/or significant heart rate changes. Clinical Course as of 03/01/23 1558  Fri Mar 01, 2023  1519 CBC with Differential(!) Evidence of hemoconcentration. [DW]  1519 Comprehensive metabolic panel(!) Mild hypokalemia otherwise  unremarkable [DW]  1519 Troponin I (High Sensitivity): 8 Will repeat 2nd trop [DW]    Clinical Course User Index [DW] Janith Lima, MD     FINAL CLINICAL IMPRESSION(S) / ED DIAGNOSES   Final diagnoses:  SVT (supraventricular tachycardia)  Chest pain, unspecified type     Rx / DC Orders   ED Discharge Orders     None        Note:  This document was prepared using Dragon voice recognition software and may include unintentional dictation errors.   Janith Lima, MD 03/01/23 [phone]

## 2023-03-05 ENCOUNTER — Encounter: Payer: Self-pay | Admitting: Cardiology

## 2023-03-05 ENCOUNTER — Ambulatory Visit: Payer: No Typology Code available for payment source | Attending: Cardiology | Admitting: Cardiology

## 2023-03-05 VITALS — BP 132/94 | HR 92 | Ht 62.0 in | Wt 204.8 lb

## 2023-03-05 DIAGNOSIS — I471 Supraventricular tachycardia, unspecified: Secondary | ICD-10-CM | POA: Diagnosis not present

## 2023-03-05 DIAGNOSIS — Z6837 Body mass index (BMI) 37.0-37.9, adult: Secondary | ICD-10-CM | POA: Diagnosis not present

## 2023-03-05 NOTE — Patient Instructions (Signed)
Medication Instructions:   Your physician recommends that you continue on your current medications as directed. Please refer to the Current Medication list given to you today.  *If you need a refill on your cardiac medications before your next appointment, please call your pharmacy*   Lab Work:  None Ordered  If you have labs (blood work) drawn today and your tests are completely normal, you will receive your results only by: MyChart Message (if you have MyChart) OR A paper copy in the mail If you have any lab test that is abnormal or we need to change your treatment, we will call you to review the results.   Testing/Procedures:  Your physician has requested that you have an echocardiogram. Echocardiography is a painless test that uses sound waves to create images of your heart. It provides your doctor with information about the size and shape of your heart and how well your heart's chambers and valves are working. This procedure takes approximately one hour. There are no restrictions for this procedure. Please do NOT wear cologne, perfume, aftershave, or lotions (deodorant is allowed). Please arrive 15 minutes prior to your appointment time.  2. We will order CT coronary calcium score $99 at our [facility] in [place]  Please call Judeth Cornfield @ [phone] to schedule  [facility] [address] [place], [place] [address]   Follow-Up: At [facility], you and your health needs are our priority.  As part of our continuing mission to provide you with exceptional heart care, we have created designated Provider Care Teams.  These Care Teams include your primary Cardiologist (physician) and Advanced Practice Providers (APPs -  Physician Assistants and Nurse Practitioners) who all work together to provide you with the care you need, when you need it.  We recommend signing up for the patient portal called "MyChart".  Sign up information  is provided on this After Visit Summary.  MyChart is used to connect with patients for Virtual Visits (Telemedicine).  Patients are able to view lab/test results, encounter notes, upcoming appointments, etc.  Non-urgent messages can be sent to your provider as well.   To learn more about what you can do with MyChart, go to [email or web address].    Your next appointment:   5 month(s)  Provider:   You may see Debbe Odea, MD or one of the following Advanced Practice Providers on your designated Care Team:   Nicolasa Ducking, NP Eula Listen, PA-C Cadence Fransico Michael, PA-C Charlsie Quest, NP

## 2023-03-05 NOTE — Progress Notes (Signed)
Cardiology Office Note:    Date:  03/05/2023   ID:  Beth Tran, DOB February 22, 1971, MRN [id number]  PCP:  Marina Goodell, MD   [facility] Providers Cardiologist:  Debbe Odea, MD     Referring MD: Marina Goodell, MD   Chief Complaint  Patient presents with   New Patient (Initial Visit)    Referred for cardiac evaluation of supraventricular tachycardia with chest pain.  Recent ED visit on 03/01/23.     Beth Tran is a 52 y.o. female who is being seen today for the evaluation of SVT at the request of Feldpausch, Madaline Guthrie, MD.   History of Present Illness:    Beth Tran is a 52 y.o. female with a hx of SVT, obesity who presents for ED follow-up.  Presented to the ED 4 days ago with symptoms of palpitations and elevated heart rate.  Patient was at work when symptoms began.  Also had chest pain and shortness of breath associated with palpitations.  EKG obtained in the ED showed SVT with heart rate of 177.  Patient was given adenosine IV 6 mg with improvement in heart rate and rhythm to sinus tachycardia/sinus rhythm.  Started on metoprolol prior to discharge.  Has not taken metoprolol yet, has not had any symptoms since.  History reviewed. No pertinent past medical history.  Past Surgical History:  Procedure Laterality Date   AUGMENTATION MAMMAPLASTY Bilateral    BREAST SURGERY     FOOT SURGERY Bilateral    ORIF WRIST FRACTURE Left 04/25/2021   Procedure: OPEN REDUCTION INTERNAL FIXATION (ORIF) WRIST FRACTURE;  Surgeon: Deeann Saint, MD;  Location: [facility] ORS;  Service: Orthopedics;  Laterality: Left;    Current Medications: Current Meds  Medication Sig   metoprolol succinate (TOPROL XL) 25 MG 24 hr tablet Take 1 tablet (25 mg total) by mouth daily. (Patient taking differently: Take 25 mg by mouth daily as needed.)   valACYclovir (VALTREX) 500 MG tablet Take 500 mg by mouth daily.     Allergies:   Patient has no known allergies.   Social History    Socioeconomic History   Marital status: Divorced    Spouse name: Not on file   Number of children: Not on file   Years of education: Not on file   Highest education level: Not on file  Occupational History   Not on file  Tobacco Use   Smoking status: Every Day    Types: Cigarettes   Smokeless tobacco: Never  Vaping Use   Vaping status: Never Used  Substance and Sexual Activity   Alcohol use: Yes    Comment: occ   Drug use: Never   Sexual activity: Not on file  Other Topics Concern   Not on file  Social History Narrative   Not on file   Social Determinants of Health   Financial Resource Strain: Not on file  Food Insecurity: Not on file  Transportation Needs: Not on file  Physical Activity: Not on file  Stress: Not on file  Social Connections: Not on file     Family History: The patient's family history includes Atrial fibrillation in her mother; Heart disease in her brother, maternal grandfather, and paternal aunt.  ROS:   Please see the history of present illness.     All other systems reviewed and are negative.  EKGs/Labs/Other Studies Reviewed:    The following studies were reviewed today:  EKG Interpretation Date/Time:  Tuesday March 05 2023 14:14:13 EDT  Ventricular Rate:  92 PR Interval:  128 QRS Duration:  76 QT Interval:  360 QTC Calculation: 445 R Axis:   71  Text Interpretation: Normal sinus rhythm Possible Left atrial enlargement Low voltage QRS Confirmed by Debbe Odea ([id number]) on 03/05/2023 2:24:33 PM    Recent Labs: 03/01/2023: ALT 22; BUN 12; Creatinine, Ser 0.88; Hemoglobin 16.2; Magnesium 2.3; Platelets 332; Potassium 3.4; Sodium 139  Recent Lipid Panel No results found for: "CHOL", "TRIG", "HDL", "CHOLHDL", "VLDL", "LDLCALC", "LDLDIRECT"   Risk Assessment/Calculations:     HYPERTENSION CONTROL Vitals:   03/05/23 1407 03/05/23 1415 03/05/23 1417  BP: (!) 132/98 (!) 142/98 (!) 132/94    The patient's blood pressure is  elevated above target today.  In order to address the patient's elevated BP: Blood pressure will be monitored at home to determine if medication changes need to be made.            Physical Exam:    VS:  BP (!) 132/94 (BP Location: Left Arm, Patient Position: Sitting, Cuff Size: Large)   Pulse 92   Ht 5\' 2"  (1.575 m)   Wt 204 lb 12.8 oz (92.9 kg)   SpO2 98%   BMI 37.46 kg/m     Wt Readings from Last 3 Encounters:  03/05/23 204 lb 12.8 oz (92.9 kg)  03/01/23 182 lb 15.7 oz (83 kg)  04/15/21 182 lb 15.7 oz (83 kg)     GEN:  Well nourished, well developed in no acute distress HEENT: Normal NECK: No JVD; No carotid bruits CARDIAC: RRR, no murmurs, rubs, gallops RESPIRATORY:  Clear to auscultation without rales, wheezing or rhonchi  ABDOMEN: Soft, non-tender, non-distended MUSCULOSKELETAL:  No edema; No deformity  SKIN: Warm and dry NEUROLOGIC:  Alert and oriented x 3 PSYCHIATRIC:  Normal affect   ASSESSMENT:    1. SVT (supraventricular tachycardia)   2. BMI 37.0-37.9, adult    PLAN:    In order of problems listed above:  SVT requiring adenosine in the ED.  No further episodes since.  Advised to start Toprol-XL 25 mg daily.  Obtain echocardiogram, refer to EP. Obesity, low-calorie diet, weight loss advised.  Follow-up after echo     Medication Adjustments/Labs and Tests Ordered: Current medicines are reviewed at length with the patient today.  Concerns regarding medicines are outlined above.  Orders Placed This Encounter  Procedures   CT CARDIAC SCORING   Ambulatory referral to Cardiac Electrophysiology   EKG 12-Lead   ECHOCARDIOGRAM COMPLETE   No orders of the defined types were placed in this encounter.   Patient Instructions  Medication Instructions:   Your physician recommends that you continue on your current medications as directed. Please refer to the Current Medication list given to you today.  *If you need a refill on your cardiac medications  before your next appointment, please call your pharmacy*   Lab Work:  None Ordered  If you have labs (blood work) drawn today and your tests are completely normal, you will receive your results only by: MyChart Message (if you have MyChart) OR A paper copy in the mail If you have any lab test that is abnormal or we need to change your treatment, we will call you to review the results.   Testing/Procedures:  Your physician has requested that you have an echocardiogram. Echocardiography is a painless test that uses sound waves to create images of your heart. It provides your doctor with information about the size and shape of your heart and how  well your heart's chambers and valves are working. This procedure takes approximately one hour. There are no restrictions for this procedure. Please do NOT wear cologne, perfume, aftershave, or lotions (deodorant is allowed). Please arrive 15 minutes prior to your appointment time.  2. We will order CT coronary calcium score $99 at our [facility] in [place]  Please call Judeth Cornfield @ [phone] to schedule  [facility] [address] [place], [place] [address]   Follow-Up: At [facility], you and your health needs are our priority.  As part of our continuing mission to provide you with exceptional heart care, we have created designated Provider Care Teams.  These Care Teams include your primary Cardiologist (physician) and Advanced Practice Providers (APPs -  Physician Assistants and Nurse Practitioners) who all work together to provide you with the care you need, when you need it.  We recommend signing up for the patient portal called "MyChart".  Sign up information is provided on this After Visit Summary.  MyChart is used to connect with patients for Virtual Visits (Telemedicine).  Patients are able to view lab/test results, encounter notes, upcoming appointments, etc.  Non-urgent messages  can be sent to your provider as well.   To learn more about what you can do with MyChart, go to [email or web address].    Your next appointment:   5 month(s)  Provider:   You may see Debbe Odea, MD or one of the following Advanced Practice Providers on your designated Care Team:   Nicolasa Ducking, NP Eula Listen, PA-C Cadence Fransico Michael, PA-C Charlsie Quest, NP   Signed, Debbe Odea, MD  03/05/2023 3:46 PM    [facility]

## 2023-03-06 ENCOUNTER — Encounter: Payer: Self-pay | Admitting: Cardiology

## 2023-03-06 ENCOUNTER — Ambulatory Visit: Payer: No Typology Code available for payment source | Attending: Cardiology | Admitting: Cardiology

## 2023-03-06 VITALS — BP 130/90 | HR 94 | Ht 62.0 in | Wt 203.2 lb

## 2023-03-06 DIAGNOSIS — I471 Supraventricular tachycardia, unspecified: Secondary | ICD-10-CM | POA: Diagnosis not present

## 2023-03-06 MED ORDER — METOPROLOL TARTRATE 25 MG PO TABS: ORAL_TABLET | ORAL | 3 refills | Status: DC

## 2023-03-06 NOTE — Patient Instructions (Addendum)
Medication Instructions:  Your physician has recommended you make the following change in your medication:  1) STOP taking Toprol XL (metoprolol succinate) 2) START taking Lopressor (metoprolol tartrate) 25 mg every 4 hours as needed for elevated heart rat *If you need a refill on your cardiac medications before your next appointment, please call your pharmacy*  Follow-Up: At [facility], you and your health needs are our priority.  As part of our continuing mission to provide you with exceptional heart care, we have created designated Provider Care Teams.  These Care Teams include your primary Cardiologist (physician) and Advanced Practice Providers (APPs -  Physician Assistants and Nurse Practitioners) who all work together to provide you with the care you need, when you need it.  Your next appointment:   6 months  Provider:   Sherie Don, NP    Other Instructions:  Valsalva Maneuver Sit down or lie on your back. Take a breath. Push that breath out against your closed mouth and nose while straining as if you're trying to poop. Hold for 15 to 20 seconds. Open your nose or mouth and breathe out.

## 2023-03-06 NOTE — Progress Notes (Signed)
Electrophysiology Office Note:    Date:  03/06/2023   ID:  Beth Tran, DOB 02/16/1971, MRN [id number]  [facility] Cardiologist:  Debbe Odea, MD  [facility] Electrophysiologist:  Lanier Prude, MD   Referring MD: Debbe Odea, MD   Chief Complaint: SVT  History of Present Illness:    Beth Tran is a 52 y.o. femalewho I am seeing today for an evaluation of SVT at the request of Dr. Azucena Cecil.  The patient was last seen by Dr. Azucena Cecil on March 05, 2019 for.  She has no significant past cardiac history.  She was in the emergency department on March 01, 2023 with an elevated heart rate.  Her heart rate was 177 bpm.  She was at work when the episode began.  She was given 6 mg of IV adenosine which converted her back to normal rhythm.  She was started on metoprolol 25 mg by mouth once daily.  She tells me she was at work walking to the break room when she had sudden onset palpitations.  She knew something was wrong immediately.  No syncope or presyncope.  Her heart rate was greater than 170 bpm for greater than 2 hours before she sought care.  She first went to an urgent care where they sent her to the emergency department.  In the emergency department I gave her adenosine which converted the rhythm back to sinus rhythm.  She is never had an episode like this before.  They prescribed her metoprolol succinate but she has not started the medication.        Their past medical, social and family history was reveiwed.   ROS:   Please see the history of present illness.    All other systems reviewed and are negative.  EKGs/Labs/Other Studies Reviewed:    The following studies were reviewed today:  March 01, 2023 EKG Narrow complex tachycardia   March 05, 2023 EKG shows sinus rhythm Normal intervals No preexcitation         Physical Exam:    VS:  BP (!) 130/90 (BP Location: Left Arm, Patient Position: Sitting, Cuff Size: Large)   Pulse 94    Ht 5\' 2"  (1.575 m)   Wt 203 lb 4 oz (92.2 kg)   SpO2 97%   BMI 37.17 kg/m     Wt Readings from Last 3 Encounters:  03/06/23 203 lb 4 oz (92.2 kg)  03/05/23 204 lb 12.8 oz (92.9 kg)  03/01/23 182 lb 15.7 oz (83 kg)     GEN:  Well nourished, well developed in no acute distress CARDIAC: RRR, no murmurs, rubs, gallops RESPIRATORY:  Clear to auscultation without rales, wheezing or rhonchi       ASSESSMENT AND PLAN:    1. 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 tach or AVRT.  I have discussed the pathophysiology of her arrhythmia during today's clinic appointment and offered treatment options including a watchful waiting approach, continuing/adding medical therapy or pursuing EP study with possible ablation.  Risk, benefits, and alternatives to EP study and radiofrequency ablation were also discussed in detail today. These risks include but are not limited to stroke, bleeding, vascular damage, tamponade, perforation, damage to the heart and other structures, AV block requiring pacemaker, worsening renal function, and death. The patient understands these risks.  After discussion about treatment options, the patient and I mutually decided  to continue with watchful waiting approach.  We will plan to touch base in clinic in 6 months to see how she is doing.  She is to reach out before then if she has more episodes and would like to proceed with EP study with ablation.  She will stop taking metoprolol succinate today.  I am going to give her prescription for Toprol tartrate 25 g by mouth every 4 hours as needed for sustained tachycardia.  I also discussed the Valsalva maneuver today.      Signed, Rossie Muskrat. Lalla Brothers, MD, [facility], [facility] 03/06/2023 10:42 AM    Electrophysiology [facility]

## 2023-04-02 ENCOUNTER — Ambulatory Visit: Payer: No Typology Code available for payment source | Attending: Cardiology

## 2023-04-15 ENCOUNTER — Other Ambulatory Visit: Payer: Self-pay | Admitting: Obstetrics & Gynecology

## 2023-04-15 DIAGNOSIS — N6489 Other specified disorders of breast: Secondary | ICD-10-CM

## 2023-05-06 ENCOUNTER — Encounter: Payer: Self-pay | Admitting: Cardiology

## 2023-05-22 ENCOUNTER — Other Ambulatory Visit: Payer: No Typology Code available for payment source

## 2023-05-23 ENCOUNTER — Ambulatory Visit: Payer: No Typology Code available for payment source | Attending: Cardiology

## 2023-05-23 DIAGNOSIS — I471 Supraventricular tachycardia, unspecified: Secondary | ICD-10-CM | POA: Diagnosis not present

## 2023-05-24 LAB — ECHOCARDIOGRAM COMPLETE
Area-P 1/2: 4.6 cm2
S' Lateral: 3.8 cm

## 2023-06-03 ENCOUNTER — Other Ambulatory Visit: Payer: No Typology Code available for payment source

## 2024-01-08 ENCOUNTER — Encounter

## 2024-01-08 ENCOUNTER — Ambulatory Visit

## 2024-01-17 ENCOUNTER — Encounter

## 2024-01-27 ENCOUNTER — Ambulatory Visit
Admission: RE | Admit: 2024-01-27 | Discharge: 2024-01-27 | Disposition: A | Discharge status: Home / Self Care | Source: Ambulatory Visit | Attending: Obstetrics & Gynecology | Admitting: Obstetrics & Gynecology

## 2024-01-27 DIAGNOSIS — N6489 Other specified disorders of breast: Secondary | ICD-10-CM

## 2024-04-16 ENCOUNTER — Other Ambulatory Visit: Payer: Self-pay | Admitting: Cardiology
